# Patient Record
Sex: Male | Born: 1970 | Race: White | Hispanic: No | Marital: Single | Smoking: Current every day smoker
Health system: Southern US, Community
[De-identification: ages and names within clinical notes are randomized; demographics above are authoritative.]

## PROBLEM LIST (undated history)

## (undated) DIAGNOSIS — H5440 Blindness, one eye, unspecified eye: Secondary | ICD-10-CM

## (undated) DIAGNOSIS — M419 Scoliosis, unspecified: Secondary | ICD-10-CM

## (undated) DIAGNOSIS — F419 Anxiety disorder, unspecified: Secondary | ICD-10-CM

## (undated) HISTORY — DX: Blindness, one eye, unspecified eye: H54.40

## (undated) HISTORY — PX: TYMPANOSTOMY TUBE PLACEMENT: SHX32

## (undated) HISTORY — DX: Scoliosis, unspecified: M41.9

---

## 2008-06-09 ENCOUNTER — Emergency Department (HOSPITAL_COMMUNITY): Admission: EM | Admit: 2008-06-09 | Discharge: 2008-06-09 | Payer: Self-pay | Admitting: Emergency Medicine

## 2008-09-16 ENCOUNTER — Emergency Department (HOSPITAL_COMMUNITY): Admission: EM | Admit: 2008-09-16 | Discharge: 2008-09-16 | Payer: Self-pay | Admitting: Emergency Medicine

## 2012-09-01 ENCOUNTER — Encounter (HOSPITAL_COMMUNITY): Payer: Self-pay | Admitting: *Deleted

## 2012-09-01 ENCOUNTER — Emergency Department (HOSPITAL_COMMUNITY)
Admission: EM | Admit: 2012-09-01 | Discharge: 2012-09-01 | Disposition: A | Discharge status: Home / Self Care | Payer: Self-pay | Attending: Emergency Medicine | Admitting: Emergency Medicine

## 2012-09-01 DIAGNOSIS — R059 Cough, unspecified: Secondary | ICD-10-CM | POA: Insufficient documentation

## 2012-09-01 DIAGNOSIS — F17200 Nicotine dependence, unspecified, uncomplicated: Secondary | ICD-10-CM | POA: Insufficient documentation

## 2012-09-01 DIAGNOSIS — J3489 Other specified disorders of nose and nasal sinuses: Secondary | ICD-10-CM | POA: Insufficient documentation

## 2012-09-01 DIAGNOSIS — J209 Acute bronchitis, unspecified: Secondary | ICD-10-CM | POA: Insufficient documentation

## 2012-09-01 DIAGNOSIS — J069 Acute upper respiratory infection, unspecified: Secondary | ICD-10-CM | POA: Insufficient documentation

## 2012-09-01 DIAGNOSIS — R05 Cough: Secondary | ICD-10-CM | POA: Insufficient documentation

## 2012-09-01 DIAGNOSIS — J40 Bronchitis, not specified as acute or chronic: Secondary | ICD-10-CM

## 2012-09-01 DIAGNOSIS — IMO0001 Reserved for inherently not codable concepts without codable children: Secondary | ICD-10-CM | POA: Insufficient documentation

## 2012-09-01 MED ORDER — PREDNISONE 10 MG PO TABS: ORAL_TABLET | ORAL | Status: DC

## 2012-09-01 MED ORDER — AMOXICILLIN 500 MG PO CAPS: 500.0000 mg | ORAL_CAPSULE | Freq: Three times a day (TID) | ORAL | Status: DC

## 2012-09-01 MED ORDER — PROMETHAZINE-DM 6.25-15 MG/5ML PO SYRP: 5.0000 mL | ORAL_SOLUTION | Freq: Four times a day (QID) | ORAL | Status: DC | PRN

## 2012-09-01 NOTE — ED Provider Notes (Signed)
Medical screening examination/treatment/procedure(s) were performed by non-physician practitioner and as supervising physician I was immediately available for consultation/collaboration.  Darris Staiger, MD 09/01/12 1729 

## 2012-09-01 NOTE — ED Notes (Signed)
Productive cough, yellow in color, began Tuesday. Fever, congestion, per pt.

## 2012-09-01 NOTE — ED Provider Notes (Signed)
History     CSN: [id number]  Arrival date & time 09/01/12  1559   First MD Initiated Contact with Patient 09/01/12 1630      Chief Complaint  Patient presents with  . Cough  . Fever  . Nasal Congestion    (Consider location/radiation/quality/duration/timing/severity/associated sxs/prior treatment) Patient is a 42 y.o. male presenting with cough and fever. The history is provided by the patient.  Cough Cough characteristics:  Productive Sputum characteristics:  Yellow Severity:  Moderate Onset quality:  Gradual Timing:  Intermittent Progression:  Worsening Chronicity:  New Smoker: yes   Context: sick contacts and upper respiratory infection   Relieved by:  Nothing Worsened by:  Nothing tried Associated symptoms: fever, myalgias, rhinorrhea and sinus congestion   Associated symptoms: no chest pain, no eye discharge, no shortness of breath and no wheezing   Fever Associated symptoms: congestion, cough, myalgias and rhinorrhea   Associated symptoms: no chest pain, no confusion and no dysuria     History reviewed. No pertinent past medical history.  History reviewed. No pertinent past surgical history.  No family history on file.  History  Substance Use Topics  . Smoking status: Current Every Day Smoker  . Smokeless tobacco: Not on file  . Alcohol Use: Yes     Comment: Occ.      Review of Systems  Constitutional: Positive for fever. Negative for activity change.       All ROS Neg except as noted in HPI  HENT: Positive for congestion and rhinorrhea. Negative for nosebleeds and neck pain.   Eyes: Negative for photophobia and discharge.  Respiratory: Positive for cough. Negative for shortness of breath and wheezing.   Cardiovascular: Negative for chest pain and palpitations.  Gastrointestinal: Negative for abdominal pain and blood in stool.  Genitourinary: Negative for dysuria, frequency and hematuria.  Musculoskeletal: Positive for myalgias. Negative for back  pain and arthralgias.  Skin: Negative.   Neurological: Negative for dizziness, seizures and speech difficulty.  Psychiatric/Behavioral: Negative for hallucinations and confusion.    Allergies  Ceclor  Home Medications  No current outpatient prescriptions on file.  BP 102/50  Pulse 97  Temp(Src) 99.6 F (37.6 C) (Oral)  Resp 20  Ht 6\' 2"  (1.88 m)  Wt 124 lb 9.6 oz (56.518 kg)  BMI 15.99 kg/m2  SpO2 100%  Physical Exam  Nursing note and vitals reviewed. Constitutional: He is oriented to person, place, and time. He appears well-developed and well-nourished.  Non-toxic appearance.  HENT:  Head: Normocephalic.  Right Ear: Tympanic membrane and external ear normal.  Left Ear: Tympanic membrane and external ear normal.  Nasal congestion  Eyes: EOM and lids are normal. Pupils are equal, round, and reactive to light.  Neck: Normal range of motion. Neck supple. Carotid bruit is not present.  Cardiovascular: Normal rate, regular rhythm, normal heart sounds, intact distal pulses and normal pulses.   Pulmonary/Chest: No respiratory distress. He has no decreased breath sounds. He has no wheezes. He has rhonchi. He has no rales.  Abdominal: Soft. Bowel sounds are normal. There is no tenderness. There is no guarding.  Musculoskeletal: Normal range of motion.  Lymphadenopathy:       Head (right side): No submandibular adenopathy present.       Head (left side): No submandibular adenopathy present.    He has no cervical adenopathy.  Neurological: He is alert and oriented to person, place, and time. He has normal strength. No cranial nerve deficit or sensory deficit.  Skin: Skin  is warm and dry.  Psychiatric: He has a normal mood and affect. His speech is normal.    ED Course  Procedures (including critical care time)  Labs Reviewed - No data to display No results found.  Pulse Ox 100% on room air. WNL by my interpretation. No diagnosis found.    MDM  I have reviewed nursing  notes, vital signs, and all appropriate lab and imaging results for this patient. Exam is consistent with bronchitis and uri. Vital signs reviewed. Plan - Rx for amoxil deltasone and promethazine DM given. Pt to increase fluids and wash hands frequently.       Kathie Dike, [place] 09/01/12 1659

## 2016-04-29 ENCOUNTER — Emergency Department (HOSPITAL_COMMUNITY)
Admission: EM | Admit: 2016-04-29 | Discharge: 2016-05-01 | Payer: Medicaid Other | Attending: Emergency Medicine | Admitting: Emergency Medicine

## 2016-04-29 ENCOUNTER — Encounter (HOSPITAL_COMMUNITY): Payer: Self-pay | Admitting: *Deleted

## 2016-04-29 DIAGNOSIS — F17200 Nicotine dependence, unspecified, uncomplicated: Secondary | ICD-10-CM | POA: Diagnosis not present

## 2016-04-29 DIAGNOSIS — F259 Schizoaffective disorder, unspecified: Secondary | ICD-10-CM | POA: Diagnosis not present

## 2016-04-29 DIAGNOSIS — Z79899 Other long term (current) drug therapy: Secondary | ICD-10-CM | POA: Diagnosis not present

## 2016-04-29 DIAGNOSIS — R45851 Suicidal ideations: Secondary | ICD-10-CM

## 2016-04-29 DIAGNOSIS — Y999 Unspecified external cause status: Secondary | ICD-10-CM | POA: Insufficient documentation

## 2016-04-29 DIAGNOSIS — Y939 Activity, unspecified: Secondary | ICD-10-CM | POA: Diagnosis not present

## 2016-04-29 DIAGNOSIS — T1491XA Suicide attempt, initial encounter: Secondary | ICD-10-CM | POA: Insufficient documentation

## 2016-04-29 DIAGNOSIS — Y929 Unspecified place or not applicable: Secondary | ICD-10-CM | POA: Diagnosis not present

## 2016-04-29 DIAGNOSIS — T50902A Poisoning by unspecified drugs, medicaments and biological substances, intentional self-harm, initial encounter: Secondary | ICD-10-CM

## 2016-04-29 DIAGNOSIS — X58XXXA Exposure to other specified factors, initial encounter: Secondary | ICD-10-CM | POA: Diagnosis not present

## 2016-04-29 LAB — CBC WITH DIFFERENTIAL/PLATELET
BASOS ABS: 0.1 10*3/uL (ref 0.0–0.1)
BASOS PCT: 1 %
EOS PCT: 4 %
Eosinophils Absolute: 0.3 10*3/uL (ref 0.0–0.7)
HCT: 41.9 % (ref 39.0–52.0)
Hemoglobin: 14.1 g/dL (ref 13.0–17.0)
LYMPHS PCT: 29 %
Lymphs Abs: 2.3 10*3/uL (ref 0.7–4.0)
MCH: 29.7 pg (ref 26.0–34.0)
MCHC: 33.7 g/dL (ref 30.0–36.0)
MCV: 88.4 fL (ref 78.0–100.0)
MONO ABS: 0.9 10*3/uL (ref 0.1–1.0)
Monocytes Relative: 11 %
NEUTROS ABS: 4.5 10*3/uL (ref 1.7–7.7)
Neutrophils Relative %: 55 %
PLATELETS: 248 10*3/uL (ref 150–400)
RBC: 4.74 MIL/uL (ref 4.22–5.81)
RDW: 13 % (ref 11.5–15.5)
WBC: 8.1 10*3/uL (ref 4.0–10.5)

## 2016-04-29 LAB — URINALYSIS, ROUTINE W REFLEX MICROSCOPIC
BILIRUBIN URINE: NEGATIVE
Glucose, UA: NEGATIVE mg/dL
LEUKOCYTES UA: NEGATIVE
NITRITE: NEGATIVE
Protein, ur: NEGATIVE mg/dL
Specific Gravity, Urine: 1.02 (ref 1.005–1.030)
pH: 6 (ref 5.0–8.0)

## 2016-04-29 LAB — COMPREHENSIVE METABOLIC PANEL
ALK PHOS: 68 U/L (ref 38–126)
ALT: 15 U/L — AB (ref 17–63)
ANION GAP: 7 (ref 5–15)
AST: 20 U/L (ref 15–41)
Albumin: 4.9 g/dL (ref 3.5–5.0)
BILIRUBIN TOTAL: 0.6 mg/dL (ref 0.3–1.2)
BUN: 13 mg/dL (ref 6–20)
CALCIUM: 9.6 mg/dL (ref 8.9–10.3)
CO2: 30 mmol/L (ref 22–32)
Chloride: 99 mmol/L — ABNORMAL LOW (ref 101–111)
Creatinine, Ser: 0.87 mg/dL (ref 0.61–1.24)
GFR calc Af Amer: >60 mL/min (ref >60)
Glucose, Bld: 113 mg/dL — ABNORMAL HIGH (ref 65–99)
POTASSIUM: 3.9 mmol/L (ref 3.5–5.1)
Sodium: 136 mmol/L (ref 135–145)
TOTAL PROTEIN: 7.7 g/dL (ref 6.5–8.1)

## 2016-04-29 LAB — RAPID URINE DRUG SCREEN, HOSP PERFORMED
Amphetamines: NOT DETECTED
BENZODIAZEPINES: NOT DETECTED
Barbiturates: NOT DETECTED
COCAINE: NOT DETECTED
OPIATES: NOT DETECTED
Tetrahydrocannabinol: POSITIVE — AB

## 2016-04-29 LAB — URINE MICROSCOPIC-ADD ON

## 2016-04-29 LAB — SALICYLATE LEVEL

## 2016-04-29 LAB — ACETAMINOPHEN LEVEL: Acetaminophen (Tylenol), Serum: <10 ug/mL — ABNORMAL LOW (ref 10–30)

## 2016-04-29 NOTE — BH Assessment (Signed)
Assessor spoke with Jared Adams in the ED and she reports the pt is in the hallway and will need 15 minutes to set up TTS .  Princess BruinsAquicha Duff, MSW, Theresia MajorsLCSWA

## 2016-04-29 NOTE — ED Notes (Signed)
MD at bedside. 

## 2016-04-29 NOTE — ED Notes (Signed)
Aunt: [phone]

## 2016-04-29 NOTE — BH Assessment (Signed)
Assessor is unable to connect to the cart to complete the assessment. Valinda in the ED has been notified and stated she would contact the RN to inform them of the issue with connecting to the cart. Assessor will attempt to complete the assessment at a later time.  Princess BruinsAquicha Duff, MSW, Theresia MajorsLCSWA

## 2016-04-29 NOTE — BH Assessment (Signed)
Unable to connect to cart 1. Cart 2 is currently located on the 3rd floor. Assessor called the ED back to set up cart 1 in the pt's room but the line continued to ring with no answer.  Jared Adams, MSW, Theresia MajorsLCSWA

## 2016-04-29 NOTE — ED Triage Notes (Signed)
Pt states he has "had enough." Pt states that on Monday he took 10 haldol and 6 trazodone in order to "just not wake up." Pt's aunt states that the pt has not been sleeping, has been pacing the floors. Aunt states that he is like a zombie and she has never seen him like this before. Pt reports SI by overdosing on pills.

## 2016-04-29 NOTE — ED Provider Notes (Signed)
[facility] Provider Note   CSN: [id number] Arrival date & time: 04/29/16  2024     History   Chief Complaint Chief Complaint  Patient presents with  . V70.1    HPI Jared Adams is a 45 y.o. male.  HPI  Pt was seen at 2130. Per pt, c/o gradual onset and worsening of persistent depression and SI for the past several weeks. Pt states he "had enough" and OD on 10 haldol and 6 trazodone "in order not to wake up" 5 days ago. Pt states he does not take his psych meds "because they make me feel like a zombie." Denies HI, no hallucinations.     History reviewed. No pertinent past medical history.  There are no active problems to display for this patient.   Past Surgical History:  Procedure Laterality Date  . TYMPANOSTOMY TUBE PLACEMENT         Home Medications    Prior to Admission medications   Medication Sig Start Date End Date Taking? Authorizing Provider  haloperidol (HALDOL) 5 MG tablet Take 5 mg by mouth 2 (two) times daily.   Yes Historical Provider, MD  Melatonin 5 MG TABS Take 5 mg by mouth daily.    Yes Historical Provider, MD  traZODone (DESYREL) 100 MG tablet Take 100 mg by mouth at bedtime.   Yes Historical Provider, MD    Family History History reviewed. No pertinent family history.  Social History Social History  Substance Use Topics  . Smoking status: Current Some Day Smoker  . Smokeless tobacco: Never Used  . Alcohol use Yes     Comment: Occ.     Allergies   Ceclor [cefaclor]   Review of Systems Review of Systems ROS: Statement: All systems negative except as marked or noted in the HPI; Constitutional: Negative for fever and chills. ; ; Eyes: Negative for eye pain, redness and discharge. ; ; ENMT: Negative for ear pain, hoarseness, nasal congestion, sinus pressure and sore throat. ; ; Cardiovascular: Negative for chest pain, palpitations, diaphoresis, dyspnea and peripheral edema. ; ; Respiratory: Negative for cough, wheezing  and stridor. ; ; Gastrointestinal: Negative for nausea, vomiting, diarrhea, abdominal pain, blood in stool, hematemesis, jaundice and rectal bleeding. . ; ; Genitourinary: Negative for dysuria, flank pain and hematuria. ; ; Musculoskeletal: Negative for back pain and neck pain. Negative for swelling and trauma.; ; Skin: Negative for pruritus, rash, abrasions, blisters, bruising and skin lesion.; ; Neuro: Negative for headache, lightheadedness and neck stiffness. Negative for weakness, altered level of consciousness, altered mental status, extremity weakness, paresthesias, involuntary movement, seizure and syncope.; Psych:  +SI, +SA, no HI, no hallucinations.      Physical Exam Updated Vital Signs BP 105/74 (BP Location: Left Arm)   Pulse 72   Temp 98.5 F (36.9 C) (Oral)   Resp 16   Ht 6\' 2"  (1.88 m)   Wt 125 lb (56.7 kg)   SpO2 99%   BMI 16.05 kg/m   Physical Exam 2135: Physical examination:  Nursing notes reviewed; Vital signs and O2 SAT reviewed;  Constitutional: Thin, Well hydrated, In no acute distress; Head:  Normocephalic, atraumatic; Eyes: EOMI, PERRL, No scleral icterus; ENMT: Mouth and pharynx normal, Mucous membranes moist; Neck: Supple, Full range of motion; Cardiovascular: Regular rate and rhythm; Respiratory: Breath sounds clear, No wheezes.  Speaking full sentences with ease, Normal respiratory effort/excursion; Chest: No deformity, Movement normal; Abdomen: Nondistended; Extremities: No deformity.; Neuro: AA&Ox3, Major CN grossly intact.  Speech clear. No  gross focal motor deficits in extremities. Climbs on and off stretcher easily by himself. Gait steady.; Skin: Color normal, Warm, Dry.; Psych:  Affect flat. Endorses SI and SA.    ED Treatments / Results  Labs (all labs ordered are listed, but only abnormal results are displayed)   EKG  EKG Interpretation None       Radiology   Procedures Procedures (including critical care time)  Medications Ordered in  ED Medications - No data to display   Initial Impression / Assessment and Plan / ED Course  I have reviewed the triage vital signs and the nursing notes.  Pertinent labs & imaging results that were available during my care of the patient were reviewed by me and considered in my medical decision making (see chart for details).  MDM Reviewed: previous chart, nursing note and vitals Reviewed previous: labs Interpretation: labs   Results for orders placed or performed during the hospital encounter of 04/29/16  Urinalysis, Routine w reflex microscopic (not at [facility])  Result Value Ref Range   Color, Urine YELLOW YELLOW   APPearance CLEAR CLEAR   Specific Gravity, Urine 1.020 1.005 - 1.030   pH 6.0 5.0 - 8.0   Glucose, UA NEGATIVE NEGATIVE mg/dL   Hgb urine dipstick MODERATE (A) NEGATIVE   Bilirubin Urine NEGATIVE NEGATIVE   Ketones, ur TRACE (A) NEGATIVE mg/dL   Protein, ur NEGATIVE NEGATIVE mg/dL   Nitrite NEGATIVE NEGATIVE   Leukocytes, UA NEGATIVE NEGATIVE  Rapid urine drug screen (hospital performed)  Result Value Ref Range   Opiates NONE DETECTED NONE DETECTED   Cocaine NONE DETECTED NONE DETECTED   Benzodiazepines NONE DETECTED NONE DETECTED   Amphetamines NONE DETECTED NONE DETECTED   Tetrahydrocannabinol POSITIVE (A) NONE DETECTED   Barbiturates NONE DETECTED NONE DETECTED  Comprehensive metabolic panel  Result Value Ref Range   Sodium 136 135 - 145 mmol/L   Potassium 3.9 3.5 - 5.1 mmol/L   Chloride 99 (L) 101 - 111 mmol/L   CO2 30 22 - 32 mmol/L   Glucose, Bld 113 (H) 65 - 99 mg/dL   BUN 13 6 - 20 mg/dL   Creatinine, Ser 1.61 0.61 - 1.24 mg/dL   Calcium 9.6 8.9 - 09.6 mg/dL   Total Protein 7.7 6.5 - 8.1 g/dL   Albumin 4.9 3.5 - 5.0 g/dL   AST 20 15 - 41 U/L   ALT 15 (L) 17 - 63 U/L   Alkaline Phosphatase 68 38 - 126 U/L   Total Bilirubin 0.6 0.3 - 1.2 mg/dL   GFR calc non Af Amer >60 >60 mL/min   GFR calc Af Amer >60 >60 mL/min   Anion gap 7 5 - 15  CBC with  Differential  Result Value Ref Range   WBC 8.1 4.0 - 10.5 K/uL   RBC 4.74 4.22 - 5.81 MIL/uL   Hemoglobin 14.1 13.0 - 17.0 g/dL   HCT 04.5 40.9 - 81.1 %   MCV 88.4 78.0 - 100.0 fL   MCH 29.7 26.0 - 34.0 pg   MCHC 33.7 30.0 - 36.0 g/dL   RDW 91.4 78.2 - 95.6 %   Platelets 248 150 - 400 K/uL   Neutrophils Relative % 55 %   Neutro Abs 4.5 1.7 - 7.7 K/uL   Lymphocytes Relative 29 %   Lymphs Abs 2.3 0.7 - 4.0 K/uL   Monocytes Relative 11 %   Monocytes Absolute 0.9 0.1 - 1.0 K/uL   Eosinophils Relative 4 %   Eosinophils Absolute  0.3 0.0 - 0.7 K/uL   Basophils Relative 1 %   Basophils Absolute 0.1 0.0 - 0.1 K/uL  Acetaminophen level  Result Value Ref Range   Acetaminophen (Tylenol), Serum <10 (L) 10 - 30 ug/mL  Salicylate level  Result Value Ref Range   Salicylate Lvl <7.0 2.8 - 30.0 mg/dL  Urine microscopic-add on  Result Value Ref Range   Squamous Epithelial / LPF 0-5 (A) NONE SEEN   WBC, UA 0-5 0 - 5 WBC/hpf   RBC / HPF 6-30 0 - 5 RBC/hpf   Bacteria, UA RARE (A) NONE SEEN   Urine-Other MUCOUS PRESENT     2300:  TTS to evaluate. Holding orders written.    Final Clinical Impressions(s) / ED Diagnoses   Final diagnoses:  None    New Prescriptions New Prescriptions   No medications on file     Samuel JesterKathleen Hutton Pellicane, DO 04/30/16 0009

## 2016-04-29 NOTE — ED Notes (Signed)
Pt reports that he lives with a friend and that it is not a good situation. Also that his family helps him when they can. He states he does not follow with his mental health provider because of different reasons, that he has been depressed and that he took an overdose of trazodone last Monday in an attempt to "end it" as he states.  He is very thin, odoriferous, and is soft spoken. His aunt is at the bedside

## 2016-04-30 MED ORDER — HALOPERIDOL 5 MG PO TABS
5.0000 mg | ORAL_TABLET | Freq: Two times a day (BID) | ORAL | Status: DC
  Administered 2016-04-30 – 2016-05-01 (×3): 5 mg via ORAL
  Filled 2016-04-30 (×3): qty 1

## 2016-04-30 MED ORDER — NICOTINE 21 MG/24HR TD PT24
21.0000 mg | MEDICATED_PATCH | Freq: Every day | TRANSDERMAL | Status: DC
  Administered 2016-04-30 – 2016-05-01 (×2): 21 mg via TRANSDERMAL
  Filled 2016-04-30 (×2): qty 1

## 2016-04-30 MED ORDER — TRAZODONE HCL 50 MG PO TABS
100.0000 mg | ORAL_TABLET | Freq: Every day | ORAL | Status: DC
  Administered 2016-04-30 (×2): 100 mg via ORAL
  Filled 2016-04-30 (×2): qty 2

## 2016-04-30 MED ORDER — MELATONIN 5 MG PO TABS
5.0000 mg | ORAL_TABLET | Freq: Every day | ORAL | Status: DC
  Filled 2016-04-30 (×4): qty 1

## 2016-04-30 NOTE — ED Notes (Signed)
Pharmacy called and said that Melatonin is not provided from our pharmacy and pt would have to supply his own if wanted. PT made aware.

## 2016-04-30 NOTE — ED Notes (Signed)
Call from aunt Ms Larina BrasStone who is updated with pt's permission
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
## 2016-04-30 NOTE — Progress Notes (Signed)
Disposition CSW completed patient referrals to the following inpatient psych facilities:  [facility]    CSW will continue to follow patient for placement needs.  Seward SpeckLeo Nanetta Wiegman [facility]W,[facility]AS Behavioral Health Disposition CSW [phone]

## 2016-04-30 NOTE — ED Notes (Signed)
Pt refuses Haldol because he reports it makes his head fuzzy- He is encouraged to reconsider. He is quiet, calm and cooperative.

## 2016-05-01 ENCOUNTER — Encounter (HOSPITAL_COMMUNITY): Payer: Self-pay

## 2016-05-01 ENCOUNTER — Inpatient Hospital Stay (HOSPITAL_COMMUNITY)
Admission: AD | Admit: 2016-05-01 | Discharge: 2016-05-09 | DRG: 885 | Disposition: A | Discharge status: Home / Self Care | Payer: No Typology Code available for payment source | Source: Intra-hospital | Attending: Psychiatry | Admitting: Psychiatry

## 2016-05-01 DIAGNOSIS — E559 Vitamin D deficiency, unspecified: Secondary | ICD-10-CM | POA: Diagnosis present

## 2016-05-01 DIAGNOSIS — Z681 Body mass index (BMI) 19 or less, adult: Secondary | ICD-10-CM | POA: Diagnosis not present

## 2016-05-01 DIAGNOSIS — F259 Schizoaffective disorder, unspecified: Secondary | ICD-10-CM | POA: Diagnosis present

## 2016-05-01 DIAGNOSIS — G4700 Insomnia, unspecified: Secondary | ICD-10-CM | POA: Diagnosis present

## 2016-05-01 DIAGNOSIS — Z818 Family history of other mental and behavioral disorders: Secondary | ICD-10-CM

## 2016-05-01 DIAGNOSIS — E43 Unspecified severe protein-calorie malnutrition: Secondary | ICD-10-CM | POA: Diagnosis present

## 2016-05-01 DIAGNOSIS — F411 Generalized anxiety disorder: Secondary | ICD-10-CM | POA: Diagnosis present

## 2016-05-01 DIAGNOSIS — R64 Cachexia: Secondary | ICD-10-CM | POA: Diagnosis present

## 2016-05-01 DIAGNOSIS — Z79899 Other long term (current) drug therapy: Secondary | ICD-10-CM | POA: Diagnosis not present

## 2016-05-01 DIAGNOSIS — F251 Schizoaffective disorder, depressive type: Secondary | ICD-10-CM | POA: Diagnosis present

## 2016-05-01 DIAGNOSIS — F17210 Nicotine dependence, cigarettes, uncomplicated: Secondary | ICD-10-CM | POA: Diagnosis present

## 2016-05-01 DIAGNOSIS — E538 Deficiency of other specified B group vitamins: Secondary | ICD-10-CM | POA: Diagnosis present

## 2016-05-01 DIAGNOSIS — Z23 Encounter for immunization: Secondary | ICD-10-CM | POA: Diagnosis not present

## 2016-05-01 DIAGNOSIS — R45851 Suicidal ideations: Secondary | ICD-10-CM | POA: Diagnosis present

## 2016-05-01 DIAGNOSIS — F333 Major depressive disorder, recurrent, severe with psychotic symptoms: Secondary | ICD-10-CM | POA: Diagnosis not present

## 2016-05-01 MED ORDER — TRAZODONE HCL 100 MG PO TABS
100.0000 mg | ORAL_TABLET | Freq: Every day | ORAL | Status: DC
  Administered 2016-05-01: 100 mg via ORAL
  Filled 2016-05-01 (×3): qty 1

## 2016-05-01 MED ORDER — MAGNESIUM HYDROXIDE 400 MG/5ML PO SUSP: 30.0000 mL | Freq: Every day | ORAL | Status: DC | PRN

## 2016-05-01 MED ORDER — NICOTINE 21 MG/24HR TD PT24
21.0000 mg | MEDICATED_PATCH | Freq: Every day | TRANSDERMAL | Status: AC
  Administered 2016-05-02 – 2016-05-03 (×2): 21 mg via TRANSDERMAL
  Filled 2016-05-01 (×2): qty 1

## 2016-05-01 MED ORDER — PNEUMOCOCCAL VAC POLYVALENT 25 MCG/0.5ML IJ INJ
0.5000 mL | INJECTION | INTRAMUSCULAR | Status: AC
  Administered 2016-05-02: 0.5 mL via INTRAMUSCULAR

## 2016-05-01 MED ORDER — INFLUENZA VAC SPLIT QUAD 0.5 ML IM SUSY
0.5000 mL | PREFILLED_SYRINGE | INTRAMUSCULAR | Status: AC
  Administered 2016-05-02: 0.5 mL via INTRAMUSCULAR
  Filled 2016-05-01: qty 0.5

## 2016-05-01 MED ORDER — ENSURE ENLIVE PO LIQD: 237.0000 mL | Freq: Two times a day (BID) | ORAL | Status: DC

## 2016-05-01 MED ORDER — HALOPERIDOL 5 MG PO TABS
5.0000 mg | ORAL_TABLET | Freq: Two times a day (BID) | ORAL | Status: DC
  Administered 2016-05-01 – 2016-05-02 (×2): 5 mg via ORAL
  Filled 2016-05-01 (×5): qty 1

## 2016-05-01 MED ORDER — BENZTROPINE MESYLATE 0.5 MG PO TABS
0.5000 mg | ORAL_TABLET | Freq: Two times a day (BID) | ORAL | Status: DC
  Administered 2016-05-01 – 2016-05-02 (×2): 0.5 mg via ORAL
  Filled 2016-05-01 (×5): qty 1

## 2016-05-01 MED ORDER — ALUM & MAG HYDROXIDE-SIMETH 200-200-20 MG/5ML PO SUSP
30.0000 mL | ORAL | Status: DC | PRN
Start: 2016-05-01 — End: 2016-05-09

## 2016-05-01 MED ORDER — ACETAMINOPHEN 325 MG PO TABS: 650.0000 mg | ORAL_TABLET | Freq: Four times a day (QID) | ORAL | Status: DC | PRN

## 2016-05-01 NOTE — Progress Notes (Signed)
[facility] Group Notes:  (Nursing/MHT/Case Management/Adjunct)  Date:  05/01/2016  Time:  9:49 PM  Type of Therapy:  Psychoeducational Skills  Participation Level:  Minimal  Participation Quality:  Attentive  Affect:  Flat  Cognitive:  Lacking  Insight:  Limited  Engagement in Group:  Limited  Modes of Intervention:  Education  Summary of Progress/Problems: The patient expressed in group that he was pleased with the fact that he is no longer across the street in the hospital.   Hazle CocaGOODMAN, [name] 05/01/2016, 9:49 PM

## 2016-05-01 NOTE — Tx Team (Signed)
Initial Treatment Plan 05/01/2016 6:32 PM Jared Adams ZOX:[id number]    PATIENT STRESSORS: Financial difficulties Medication change or noncompliance   PATIENT STRENGTHS: Wellsite geologistCommunication skills General fund of knowledge Supportive family/friends   PATIENT IDENTIFIED PROBLEMS: Depression  Suicidal ideation  Psychosis  "Feel better about myself"  "Be able to take care of myself"             DISCHARGE CRITERIA:  Improved stabilization in mood, thinking, and/or behavior Verbal commitment to aftercare and medication compliance  PRELIMINARY DISCHARGE PLAN: Outpatient therapy Return to previous living arrangement Medication management  PATIENT/FAMILY INVOLVEMENT: This treatment plan has been presented to and reviewed with the patient, Jared Adams.  The patient and family have been given the opportunity to ask questions and make suggestions.  Levin BaconHeather V Jonathon Castelo, RN 05/01/2016, 6:32 PM

## 2016-05-01 NOTE — Progress Notes (Signed)
[facility].  Patient ate good dinner, stated he was not hungry after dinner.  Patient took his medications without any problems.  Patient denied SI and HI, contracts for safety while talking to nurse.  Denied A/V hallucinations.  Safety maintained with 15 minute checks.

## 2016-05-01 NOTE — ED Notes (Signed)
Out of bed to toilet

## 2016-05-01 NOTE — Progress Notes (Signed)
Jared Adams is a 45 year old male being admitted voluntarily to 506-1 from [facility].  He came to the ED with suicidal thoughts and attempted to OD.  He reported that his currently living situation is stressful.  He reported that his roommate told him that he has been pacing the floors but he doesn't remember doing this.  He stopped taking his medication 2-3 weeks ago because of side effects.  HE is reporting problems sleeping and hasn't slept in 2 days.  [facility] he became unresponsive but then reported that he has been hearing things and that he has "so many bad things in my head."  He reported feeling irritable, sad, worthless, isolation and thoughts of harming others at times ("when I am mad").  He is diagnosed with Schizoaffective disorder and Cannabis disorder. He currently denies SI/HI or A/V hallucinations.  He reports that his voices come and go.   He denies any medical issues and appears to be in no physical distress.  He is severely underweight and he stated that he hasn't been eating much because he doesn't have the money to get food.  Oriented him to the unit.  Admission paperwork completed and signed.  Belongings searched and secured in locker #  26.  Skin assessment completed and no skin issues noted.  Q 15 minute checks initiated for safety.  We will monitor the progress towards his goals.

## 2016-05-01 NOTE — Progress Notes (Signed)
[facility] attending Dr. Elna BreslowEappen, report [phone]. Requested voluntary consent be faxed to [phone].  Ilean SkillMeghan Chamari Cutbirth, MSW, LCSW Clinical Social Work, Disposition  05/01/2016 [phone]

## 2016-05-01 NOTE — Progress Notes (Signed)
D: Pt passive SI-contracts for safety denies HI/AVH. Pt is pleasant and cooperative. Pt getting accustomed to the unit, pt seen isolating to himself, but will talk when approached.   A: Pt was offered support and encouragement. Pt was given scheduled medications. Pt was encourage to attend groups. Q 15 minute checks were done for safety.   R:Pt attends groups and interacts well with peers and staff. Pt is taking medication. Pt has no complaints at this time .Pt receptive to treatment and safety maintained on unit.

## 2016-05-01 NOTE — ED Notes (Signed)
[facility].

## 2016-05-02 ENCOUNTER — Encounter (HOSPITAL_COMMUNITY): Payer: Self-pay | Admitting: Psychiatry

## 2016-05-02 DIAGNOSIS — F17210 Nicotine dependence, cigarettes, uncomplicated: Secondary | ICD-10-CM

## 2016-05-02 DIAGNOSIS — F333 Major depressive disorder, recurrent, severe with psychotic symptoms: Principal | ICD-10-CM

## 2016-05-02 DIAGNOSIS — Z79899 Other long term (current) drug therapy: Secondary | ICD-10-CM

## 2016-05-02 DIAGNOSIS — Z818 Family history of other mental and behavioral disorders: Secondary | ICD-10-CM

## 2016-05-02 MED ORDER — TRAZODONE HCL 100 MG PO TABS
100.0000 mg | ORAL_TABLET | Freq: Every evening | ORAL | Status: DC | PRN
  Administered 2016-05-02: 100 mg via ORAL
  Filled 2016-05-02 (×3): qty 1

## 2016-05-02 MED ORDER — OLANZAPINE 10 MG IM SOLR: 2.5000 mg | Freq: Three times a day (TID) | INTRAMUSCULAR | Status: DC | PRN

## 2016-05-02 MED ORDER — OLANZAPINE 5 MG PO TABS
5.0000 mg | ORAL_TABLET | Freq: Every day | ORAL | Status: DC
  Administered 2016-05-02 – 2016-05-08 (×7): 5 mg via ORAL
  Filled 2016-05-02 (×7): qty 1
  Filled 2016-05-02 (×2): qty 7
  Filled 2016-05-02: qty 1

## 2016-05-02 MED ORDER — TRAZODONE HCL 100 MG PO TABS
100.0000 mg | ORAL_TABLET | Freq: Every day | ORAL | Status: DC
  Administered 2016-05-02 – 2016-05-08 (×7): 100 mg via ORAL
  Filled 2016-05-02 (×5): qty 1
  Filled 2016-05-02: qty 7
  Filled 2016-05-02 (×2): qty 1
  Filled 2016-05-02: qty 7
  Filled 2016-05-02: qty 1

## 2016-05-02 MED ORDER — CITALOPRAM HYDROBROMIDE 20 MG PO TABS
20.0000 mg | ORAL_TABLET | Freq: Every day | ORAL | Status: DC
  Administered 2016-05-02 – 2016-05-08 (×7): 20 mg via ORAL
  Filled 2016-05-02 (×9): qty 1

## 2016-05-02 MED ORDER — ENSURE ENLIVE PO LIQD
237.0000 mL | Freq: Three times a day (TID) | ORAL | Status: DC
  Administered 2016-05-02 – 2016-05-09 (×23): 237 mL via ORAL

## 2016-05-02 MED ORDER — HYDROXYZINE HCL 25 MG PO TABS
25.0000 mg | ORAL_TABLET | Freq: Four times a day (QID) | ORAL | Status: DC | PRN
  Administered 2016-05-02 – 2016-05-07 (×5): 25 mg via ORAL
  Filled 2016-05-02 (×6): qty 1
  Filled 2016-05-02: qty 10

## 2016-05-02 MED ORDER — OLANZAPINE 2.5 MG PO TABS
2.5000 mg | ORAL_TABLET | Freq: Three times a day (TID) | ORAL | Status: DC | PRN
  Administered 2016-05-05 – 2016-05-07 (×3): 2.5 mg via ORAL
  Filled 2016-05-02 (×4): qty 1

## 2016-05-02 NOTE — Progress Notes (Signed)
Adult Psychoeducational Group Note  Date:  05/02/2016 Time:  8:56 PM  Group Topic/Focus:  Wrap-Up Group:   The focus of this group is to help patients review their daily goal of treatment and discuss progress on daily workbooks.   Participation Level:  Active  Participation Quality:  Appropriate  Affect:  Appropriate  Cognitive:  Appropriate  Insight: Appropriate  Engagement in Group:  Engaged  Modes of Intervention:  Discussion  Additional Comments:  The patient expressed that he attended groups.The patient also said that he rates today a 7. Octavio Mannshigpen, Deajah Erkkila Lee 05/02/2016, 8:56 PM

## 2016-05-02 NOTE — Plan of Care (Signed)
Problem: Coping: Goal: Ability to cope will improve Outcome: Progressing Pt stated he felt depressed this evening, but was feeling a little better than he felt yesterday

## 2016-05-02 NOTE — Progress Notes (Signed)
Patient ID: Gayla DossJoseph B Conde, male   DOB: 1970/09/28, 45 y.o.   MRN: [id number]   D  ---   Pt agrees to contract for safety and denies pain.  He maintains  disheveled appearence and is very under weight.  He has difficulty eating or drinking and tends to drool down his chin .  He is a not not admit because of his bizarre behaviors. Pt has straight forward gaze and slow gait.  He shows no negative behaviors , appears to be mildly confused.  Pt EKG is com[pleted and posted on front of his chart.  --- A ---  Support provided  ---  R  --  Pt remains safe on unit
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
## 2016-05-02 NOTE — Progress Notes (Signed)
Recreation Therapy Notes  Date: 05/02/16 Time: 1000 Location: 500 Hall Dayroom  Group Topic: Coping Skills  Goal Area(s) Addresses:  Pt will able to identify positive coping skills. Pt will be able to identify the importance of coping skills. Pt will be abel to identify how using coping skills will help them post d/c.  Intervention: Worksheet, scissors, glue sticks, magazines  Activity: My Engineer, siteCoping Skills Worksheet.  Patients were given worksheets divided into five areas where coping skills can be used.  The areas were diversions, social, cognitive, tension releasers and physical.  Patients were to use magazines to cut out pictures that depict coping skills that could be used in each area.  Patients were to come up with at least two coping skills per area.   Education: PharmacologistCoping Skills, Building control surveyorDischarge Planning.   Education Outcome: Acknowledges understanding/In group clarification offered/Needs additional education.   Clinical Observations/Feedback: Pt did not attend group.   Caroll RancherMarjette Dequane Strahan, LRT/CTRS     Caroll RancherLindsay, Altair Stanko A 05/02/2016 12:31 PM

## 2016-05-02 NOTE — B[facility] Suicide Risk Assessment (Signed)
[facility]   Nursing information obtained from:  Patient Demographic factors:  Male, Caucasian, Low socioeconomic status Current Mental Status:  NA Loss Factors:  Decline in physical health, Financial problems / change in socioeconomic status Historical Factors:  Family history of mental illness or substance abuse, Family history of suicide, Prior suicide attempts Risk Reduction Factors:  NA  Total Time spent with patient: 30 minutes Principal Problem: MDD (major depressive disorder), recurrent, severe, with psychosis (HCC) Diagnosis:   Patient Active Problem List   Diagnosis Date Noted  . MDD (major depressive disorder), recurrent, severe, with psychosis (HCC) [F33.3] 05/02/2016   Subjective Data: Please see H&P.   Continued Clinical Symptoms:  Alcohol Use Disorder Identification Test Final Score (AUDIT): 1 The "Alcohol Use Disorders Identification Test", Guidelines for Use in Primary Care, Second Edition.  World Science writerHealth Organization [facility](WHO). Score between 0-7:  no or low risk or alcohol related problems. Score between 8-15:  moderate risk of alcohol related problems. Score between 16-19:  high risk of alcohol related problems. Score 20 or above:  warrants further diagnostic evaluation for alcohol dependence and treatment.   CLINICAL FACTORS:   Depression:   Hopelessness Impulsivity Unstable or Poor Therapeutic Relationship Previous Psychiatric Diagnoses and Treatments Medical Diagnoses and Treatments/Surgeries   Musculoskeletal: Strength & Muscle Tone: within normal limits Gait & Station: normal Patient leans: N/A  Psychiatric Specialty Exam: Physical Exam  ROS  Blood pressure (!) 91/59, pulse 97, temperature 98 F (36.7 C), temperature source Oral, resp. rate 16, height 6' 0.5" (1.842 m), weight 51.3 kg (113 lb).Body mass index is 15.11 kg/m.                  Please see H&P.                                          COGNITIVE FEATURES THAT CONTRIBUTE TO RISK:  Closed-mindedness, Polarized thinking and Thought constriction (tunnel vision)    SUICIDE RISK:   Severe:  Frequent, intense, and enduring suicidal ideation, specific plan, no subjective intent, but some objective markers of intent (i.e., choice of lethal method), the method is accessible, some limited preparatory behavior, evidence of impaired self-control, severe dysphoria/symptomatology, multiple risk factors present, and few if any protective factors, particularly a lack of social support.   PLAN OF CARE: Please see H&P.  I certify that inpatient services furnished can reasonably be expected to improve the patient's condition.  Margarette Vannatter, MD 05/02/2016, 11:17 AM

## 2016-05-02 NOTE — Progress Notes (Signed)
D: Pt passive SI- contracts for safety. denies HI/AVH. Pt is pleasant and cooperative. Pt continues to isolate to self, but pt is visible on the unit at times. Pt stated he did not like the drama of living with his roommate, but pt plans to stay with his aunt on D/C. Pt said the Haldol made him feel like a Zombie, so he wanted to get a medication that helped him and did not make him feel drowsy all day long.   A: Pt was offered support and encouragement. Pt was given scheduled medications. Pt was encourage to attend groups. Q 15 minute checks were done for safety.   R:Pt attends groups  Pt is taking medication. Pt receptive to treatment and safety maintained on unit.

## 2016-05-02 NOTE — Progress Notes (Signed)
NUTRITION ASSESSMENT  Pt identified as at risk on the Malnutrition Screen Tool  INTERVENTION: 1. Supplements: Ensure Enlive po TID, each supplement provides 350 kcal and 20 grams of protein  NUTRITION DIAGNOSIS: Unintentional weight loss related to sub-optimal intake as evidenced by pt report.   Goal: Pt to meet >/= 90% of their estimated nutrition needs.  Monitor:  PO intake  Assessment:  Pt admitted with schizoaffective disorder. Pt reports not being able to afford food which is why he has not been eating well PTA. Pt has been ordered Ensure supplements BID, will increase to TID given poor PO intake and underweight status. Chart review shows 12 lb weight loss over 2 days, suspect this is an error. Would expect weight loss given poor PO but unable to quantify at this time.    Height: Ht Readings from Last 1 Encounters:  05/01/16 6' 0.5" (1.842 m)    Weight: Wt Readings from Last 1 Encounters:  05/01/16 113 lb (51.3 kg)    Weight Hx: Wt Readings from Last 10 Encounters:  05/01/16 113 lb (51.3 kg)  04/29/16 125 lb (56.7 kg)  09/01/12 124 lb 9.6 oz (56.5 kg)    BMI:  Body mass index is 15.11 kg/m. Pt meets criteria for underweight based on current BMI.  Estimated Nutritional Needs: Kcal: 25-30 kcal/kg Protein: > 1 gram protein/kg Fluid: 1 ml/kcal  Diet Order: Diet regular Room service appropriate? Yes; Fluid consistency: Thin Pt is also offered choice of unit snacks mid-morning and mid-afternoon.  Pt is eating as desired.   Lab results and medications reviewed.   Tilda FrancoLindsey Palyn Scrima, MS, RD, LDN Pager: [phone] After Hours Pager: [phone]

## 2016-05-02 NOTE — Progress Notes (Signed)
Recreation Therapy Notes  INPATIENT RECREATION THERAPY ASSESSMENT  Patient Details Name: Gayla DossJoseph B Dock MRN: [id number] DOB: May 12, 1971 Today's Date: 05/02/2016  Patient Stressors: Other (Comment) (Living situation)  Pt stated he was here because he needed some help.  Coping Skills:   Isolate, Arguments, Substance Abuse, Self-Injury, Exercise, Talking, Music  Personal Challenges: Anger, Communication, Concentration, Decision-Making, Expressing Yourself, Problem-Solving, Relationships, Self-Esteem/Confidence, Social Interaction, Stress Management, Substance Abuse, Time Management, Trusting Others  Leisure Interests (2+):  Individual - TV, Music - Listen  Awareness of Community Resources:  No  Patient Strengths:  Good personality, Kind hearted  Patient Identified Areas of Improvement:  Attitude, dealing with certain situations (not worring so much)  Current Recreation Participation:  Every other day  Patient Goal for Hospitalization:  "Better myself"  [place]   Current [place] (including self-harm):  Yes (Rated a 5, contracted for safety)  Current HI:  No  Consent to Intern Participation: N/A   Caroll RancherMarjette Ralph Brouwer, LRT/CTRS  Caroll RancherLindsay, Sarath Privott A 05/02/2016, 12:39 PM

## 2016-05-02 NOTE — BHH Group Notes (Signed)
BHH LCSW Group Therapy  05/02/2016 12:05 PM   Type of Therapy:  Group Therapy  Participation Level:  Active  Participation Quality:  Attentive  Affect:  Appropriate  Cognitive:  Appropriate  Insight:  Improving  Engagement in Therapy:  Engaged  Modes of Intervention:  Clarification, Education, Exploration and Socialization  Summary of Progress/Problems: Today's group focused on resilience.  Stayed the entire time, engaged throughout.  Talked about how being released from incarceration required the ability to bounce back "because I was coming out to nothing. I had no place to stay, no supports and I lost the right to see my son.  It's not been easy, but I have a stubborn streak that has kept me going." Colleenfortorth, Teofil Maniaci B 05/02/2016 , 12:05 PM

## 2016-05-02 NOTE — H&P (Signed)
Psychiatric Admission Assessment Adult  Patient Identification: Jared Adams MRN:  [id number] Date of Evaluation:  05/02/2016 Chief Complaint: Patient states " I was tried to kill myself, it was my living situation."   Principal Diagnosis: MDD (major depressive disorder), recurrent, severe, with psychosis (HCC) Diagnosis:   Patient Active Problem List   Diagnosis Date Noted  . MDD (major depressive disorder), recurrent, severe, with psychosis (HCC) [F33.3] 05/02/2016     History of Present Illness: Jared DossJoseph B Dobkins is a 45 y.o. [place] male who is single , unemployed ( has applied for disability) , lives [place] a friend in [place], [place], has a hx of ? Schizoaffective do versus MDD, who presented to [facility] , brought in by his aunt after a suicide attempt by OD on his medications.  Per initial notes in EHR : ' Pt presents to the ED voluntarily. Pt reports he attempted to OD on medication and is still feeling suicidal that he "just does not want to wake up." Pt reports he has been feeling suicidal lately and when asked for his recent stressors he stated "just my living situation is stressful." Pt reports he lives with a roommate and pt's roommate told him he was "pacing around the room back and forth" and the pt states he does not recall this. During the assessment, the pt was experiencing thought blocking as he suddenly stopped talking and became unresponsive for about 10 seconds. The pt reports he sometimes "hears things" and stated he has "so many bad things in my head." Pt reports he stopped taking his medication about 2 or 3 weeks ago because it was making him feel lethargic. Pt reports he has not slept in 2 days and pt reports he eats only once per day. Pt endorses depressive symptoms including isolating himself, irritable feelings, and feeling sad and worthless "everyday." Pt denies H/I however when he was asked he stated, "sometimes when people make me mad I think about hurting  them."   Patient seen and chart reviewed today .Discussed patient with treatment team. Patient today is seen as lethargic, emaciated , reports he is depressed and had attempted to OD on several 5 mg Haldol and 100 mg trazodone prior to admission. Pt reports he has a hx of depression and has [facility]. Pt reports he does not feel haldol is helping much , hence had stopped taking it. Pt also reports that Haldol gives his side effects and makes him "zombie like." Patient reports sadness daily since the past several days , sleep issues, hopelessness, difficulty concentrating , and SI , is S/P attempt. Pt keeps repeating that "it is my life situation." Patient reports he has been living with a friend since the past several years , he is unemployed , he eats whatever the friend gives him , he is unable to work due to his physical and emotional problems, he is currently too emaciated , has difficulty even getting out of bed often. Pt reports AH - telling him negative things. Pt with several psychosocial stressors - has no family support, has had legal issues in the past , is unemployed , financial issues.  Pt reports this is his first admission to a psychiatric hospital and reports this as his first suicide attempt.  Pt reports he does not know if he has any medical issues since he has not had a health check up with a PMD since he were a teenager.   Associated Signs/Symptoms: Depression Symptoms:  depressed mood,  anhedonia, insomnia, psychomotor retardation, fatigue, feelings of worthlessness/guilt, difficulty concentrating, hopelessness, recurrent thoughts of death, suicidal thoughts with specific plan, suicidal attempt, anxiety, weight loss, (Hypo) Manic Symptoms:  Hallucinations, Impulsivity, Anxiety Symptoms:  Excessive Worry, Psychotic Symptoms:  Hallucinations: Auditory PTSD Symptoms: Negative Total Time spent with patient: 45 minutes  Past Psychiatric  History: Patient with hx of depression , [facility], denies any hx of IP admission prior to this, denies suicide attempts prior to this one which led to his admission this time.  Is the patient at risk to self? Yes.    Has the patient been a risk to self in the past 6 months? No.  Has the patient been a risk to self within the distant past? No.  Is the patient a risk to others? No.  Has the patient been a risk to others in the past 6 months? No.  Has the patient been a risk to others within the distant past? No.   Prior Inpatient Therapy:   Prior Outpatient Therapy:    Alcohol Screening: 1. How often do you have a drink containing alcohol?: Monthly or less 2. How many drinks containing alcohol do you have on a typical day when you are drinking?: 1 or 2 3. How often do you have six or more drinks on one occasion?: Never Preliminary Score: 0 9. Have you or someone else been injured as a result of your drinking?: No 10. Has a relative or friend or a doctor or another health worker been concerned about your drinking or suggested you cut down?: No Alcohol Use Disorder Identification Test Final Score (AUDIT): 1 Brief Intervention: AUDIT score less than 7 or less-screening does not suggest unhealthy drinking-brief intervention not indicated Substance Abuse History in the last 12 months:  Yes.  cannabis occasional Consequences of Substance Abuse: Negative Previous Psychotropic Medications: Yes celexa,( good effect) , haldol( side effects) Psychological Evaluations: No  Past Medical History: Patient reports he has not been to a medical doctor for a check up in years. Past Surgical History:  Procedure Laterality Date  . TYMPANOSTOMY TUBE PLACEMENT     Family History:  Family History  Problem Relation Age of Onset  . Schizophrenia Maternal Uncle   . Suicidality Maternal Uncle    Family Psychiatric  History: Patient reports his uncle had schizophrenia and killed himself by  jumping off a cellphone tower. Tobacco Screening: Have you used any form of tobacco in the last 30 days? (Cigarettes, Smokeless Tobacco, Cigars, and/or Pipes): Yes Tobacco use, Select all that apply: 5 or more cigarettes per day Are you interested in Tobacco Cessation Medications?: Yes, will notify MD for an order Counseled patient on smoking cessation including recognizing danger situations, developing coping skills and basic information about quitting provided: Refused/Declined practical counseling Social History:  Patient is unemployed , lives with a friend who lets him stay , has no source of income , is unemployed and is unable to work due to being physically limited and also emotionally. History  Alcohol Use  . Yes    Comment: Occ.     History  Drug Use  . Types: Marijuana    Additional Social History:                           Allergies:   Allergies  Allergen Reactions  . Ceclor [Cefaclor] Hives and Rash   Lab Results: No results found for this or any previous visit (from  the past 48 hour(s)).  Blood Alcohol level:  No results found for: [facility]  Metabolic Disorder Labs:  No results found for: HGBA1C, MPG No results found for: PROLACTIN No results found for: CHOL, TRIG, HDL, CHOLHDL, VLDL, LDLCALC  Current Medications: Current Facility-Administered Medications  Medication Dose Route Frequency Provider Last Rate Last Dose  . acetaminophen (TYLENOL) tablet 650 mg  650 mg Oral Q6H PRN Thermon Leyland, NP      . alum & mag hydroxide-simeth (MAALOX/MYLANTA) 200-200-20 MG/5ML suspension 30 mL  30 mL Oral Q4H PRN Thermon Leyland, NP      . citalopram (CELEXA) tablet 20 mg  20 mg Oral Daily Wynne Jury, MD   20 mg at 05/02/16 1046  . feeding supplement (ENSURE ENLIVE) (ENSURE ENLIVE) liquid 237 mL  237 mL Oral TID BM Cuma Polyakov, MD   237 mL at 05/02/16 1031  . hydrOXYzine (ATARAX/VISTARIL) tablet 25 mg  25 mg Oral Q6H PRN Jomarie Longs, MD      . magnesium hydroxide  (MILK OF MAGNESIA) suspension 30 mL  30 mL Oral Daily PRN Thermon Leyland, NP      . nicotine (NICODERM CQ - dosed in mg/24 hours) patch 21 mg  21 mg Transdermal Daily Thermon Leyland, NP   21 mg at 05/02/16 0800  . OLANZapine (ZYPREXA) tablet 2.5 mg  2.5 mg Oral TID PRN Jomarie Longs, MD       Or  . OLANZapine (ZYPREXA) injection 2.5 mg  2.5 mg Intramuscular TID PRN Jomarie Longs, MD      . OLANZapine (ZYPREXA) tablet 5 mg  5 mg Oral QHS Jakarius Flamenco, MD      . traZODone (DESYREL) tablet 100 mg  100 mg Oral QHS Jomarie Longs, MD       PTA Medications: Prescriptions Prior to Admission  Medication Sig Dispense Refill Last Dose  . haloperidol (HALDOL) 5 MG tablet Take 5 mg by mouth 2 (two) times daily.   04/24/2016 at Unknown time  . Melatonin 5 MG TABS Take 5 mg by mouth daily.    04/29/2016 at Unknown time  . traZODone (DESYREL) 100 MG tablet Take 100 mg by mouth at bedtime.   04/28/2016 at Unknown time    Musculoskeletal: Strength & Muscle Tone: within normal limits Gait & Station: normal Patient leans: N/A  Psychiatric Specialty Exam: Physical Exam  Nursing note and vitals reviewed. Constitutional:  I concur with PE done in ED.    Review of Systems  Psychiatric/Behavioral: Positive for depression, hallucinations and suicidal ideas. The patient is nervous/anxious and has insomnia.   All other systems reviewed and are negative.   Blood pressure (!) 91/59, pulse 97, temperature 98 F (36.7 C), temperature source Oral, resp. rate 16, height 6' 0.5" (1.842 m), weight 51.3 kg (113 lb).Body mass index is 15.11 kg/m.  General Appearance: Disheveled  Eye Contact:  Fair  Speech:  Slow  Volume:  Decreased  Mood:  Anxious, Depressed and Dysphoric  Affect:  Congruent  Thought Process:  Goal Directed and Descriptions of Associations: Intact  Orientation:  Full (Time, Place, and Person)  Thought Content:  Hallucinations: Auditory and Rumination  Suicidal Thoughts:  Yes. Is S/P suicide  attempt by OD, continues to ruminate  Homicidal Thoughts:  No  Memory:  Immediate;   Fair Recent;   Fair Remote;   Fair  Judgement:  Impaired  Insight:  Shallow  Psychomotor Activity:  Decreased and Tremor  Concentration:  Concentration: Poor and Attention Span:  Poor  Recall:  [employer] of Knowledge:  Fair  Language:  Fair  Akathisia:  No  Handed:  Right  AIMS (if indicated):     Assets:  Desire for Improvement  ADL's:  Intact  Cognition:  WNL  Sleep:  Number of Hours: 5.5    Treatment Plan Summary:Aldon B Sally is a 45 y.o. [place] male who is single , unemployed ( has applied for disability) , lives with a friend in [place], [place], has a hx of ? Schizoaffective do versus MDD, who presented to [facility] , brought in by his aunt after a suicide attempt by OD on his medications.  Patient today is seen as very emaciated , has difficulty walking and getting out of bed due to having low energy , appears depressed, is S/P suicide attempt by OD - Will start treatment and observe on the unit.  Daily contact with patient to assess and evaluate symptoms and progress in treatment and Medication management   Patient will benefit from inpatient treatment and stabilization.  Estimated length of stay is 5-7 days.  Reviewed past medical records,treatment plan.  Will start a trial of Zyprexa 5 mg po qhs for psychosis, augment the AD as well as to increase appetite. Will restart Celexa 20 mg po daily for affective sx. Will restart Trazodone 100 mg po qhs prn for sleep. Will make available PRN medications as per agitation protocol. Will get Dietician consult for diet recommendations. Will continue to monitor vitals ,medication compliance and treatment side effects while patient is here.  Will monitor for medical issues as well as call consult as needed.  Reviewed labs-  Cbc - wnl, cmp - wnl ,will order EKG for qtc , lipid panel, hba1c, pl as well as vitamin b12, folate, vitamin d.Discussed  getting STD testing - however pt denies IV drug abuse or hx of risky sexual encounters- will reassess. CSW will start working on disposition. CSW to [facility] with patient consent. Patient to participate in therapeutic milieu .      Observation Level/Precautions:  15 minute checks    Psychotherapy:  Individual and group therapy     Consultations:  CSW, Dietician   Discharge Concerns: stability and safety        Physician Treatment Plan for Primary Diagnosis: MDD (major depressive disorder), recurrent, severe, with psychosis (HCC) Long Term Goal(s): Improvement in symptoms so as ready for discharge  Short Term Goals: Ability to disclose and discuss suicidal ideas and Compliance with prescribed medications will improve  Physician Treatment Plan for Secondary Diagnosis: Principal Problem:   MDD (major depressive disorder), recurrent, severe, with psychosis (HCC)  Long Term Goal(s): Improvement in symptoms so as ready for discharge  Short Term Goals: Ability to disclose and discuss suicidal ideas and Compliance with prescribed medications will improve  I certify that inpatient services furnished can reasonably be expected to improve the patient's condition.    Sandeep Delagarza, MD 10/17/201711:43 AM

## 2016-05-03 DIAGNOSIS — E43 Unspecified severe protein-calorie malnutrition: Secondary | ICD-10-CM | POA: Clinically undetermined

## 2016-05-03 LAB — LIPID PANEL
CHOL/HDL RATIO: 2.6 ratio
Cholesterol: 156 mg/dL (ref 0–200)
HDL: 61 mg/dL (ref >40)
LDL CALC: 70 mg/dL (ref 0–99)
TRIGLYCERIDES: 126 mg/dL (ref <150)
VLDL: 25 mg/dL (ref 0–40)

## 2016-05-03 LAB — TSH: TSH: 1.266 u[IU]/mL (ref 0.350–4.500)

## 2016-05-03 LAB — FOLATE: Folate: 13.1 ng/mL (ref >5.9)

## 2016-05-03 LAB — VITAMIN B12: Vitamin B-12: 236 pg/mL (ref 180–914)

## 2016-05-03 MED ORDER — VITAMIN B-12 100 MCG PO TABS
100.0000 ug | ORAL_TABLET | Freq: Every day | ORAL | Status: DC
  Administered 2016-05-03 – 2016-05-09 (×7): 100 ug via ORAL
  Filled 2016-05-03: qty 1
  Filled 2016-05-03: qty 7
  Filled 2016-05-03 (×4): qty 1
  Filled 2016-05-03: qty 7
  Filled 2016-05-03 (×4): qty 1

## 2016-05-03 MED ORDER — TRAZODONE HCL 100 MG PO TABS
100.0000 mg | ORAL_TABLET | Freq: Once | ORAL | Status: AC
Start: 2016-05-03 — End: 2016-05-03
  Administered 2016-05-03: 100 mg via ORAL
  Filled 2016-05-03: qty 1

## 2016-05-03 NOTE — Progress Notes (Signed)
Recreation Therapy Notes  Date: 05/03/16 Time: 1000 Location: [address] Dayroom  Group Topic: Leisure Education  Goal Area(s) Addresses:  Patient will identify positive leisure activities.  Patient will identify one positive benefit of participation in leisure activities.   Behavioral Response: Observed  Intervention: Dry erase board, eraser, dry erase marker, various activities on strips of paper  Activity: Leisure Pictionary.  One patient will come to the board and pick a strip of paper from the container, which contains an activity on it, and draw it on the board.  The remaining patients will attempt to guess what is being drawn on the board.  The person that guesses the picture, will go next.  If there are enough participants, the group can be broken up into teams.  Education:  Leisure Education, Building control surveyorDischarge Planning  Education Outcome: Acknowledges education/In group clarification offered/Needs additional education  Clinical Observations/Feedback: Pt observed as his peers participated in the activity.  Pt was flat during group.  Pt expressed leisure "helps you get exercise".  Pt also stated his favorite leisure activity was basketball.   Caroll RancherMarjette Jaheim Canino, LRT/CTRS     Lillia AbedLindsay, Medardo Hassing A 05/03/2016 11:50 AM

## 2016-05-03 NOTE — Plan of Care (Signed)
Problem: Safety: Goal: Periods of time without injury will increase Outcome: Progressing Q 15 minutes safety checks maintained on and off unit without self injurious behavior / gestures to report at this time.   Problem: Medication: Goal: Compliance with prescribed medication regimen will improve Outcome: Progressing Pt is compliant with current medication regimen. Denies adverse drug reactions at this time.

## 2016-05-03 NOTE — Progress Notes (Signed)
   D: When asked about his day pt stated, "good, can't complaint", during this time pt avoided looking at the writer. Pt did take his scheduled meds without incident, but didn't forward any information to the Clinical research associatewriter. Pt has no questions or concerns.    A:  Support and encouragement was offered. 15 min checks continued for safety.  R: Pt remains safe.

## 2016-05-03 NOTE — Progress Notes (Signed)
D: Pt presents with depressed affect and mood this shift. Guarded, isolative to his room but forwards during conversations. Endorsed passive SI, but verbally contracts for safety. Denies HI, AVH and pain this shift. Rated his depression, anxiety and hopelessness all 5/10 but "I'm alright" . Pt's goal is "feeling better about myself" which he plans to obtained by "trying to stay positive".  A: Emotional support and availability provided to pt. Encouraged to voice concerns and comply with unit routines and treatment regimen. Medications administered as prescribed with verbal education. Q 15 minutes checks done for safety without self harm gestures or outburst to note thus far.  R: Pt receptive to care. Attended scheduled unit groups. Compliant with medications when offered. Denies adverse drug reactions when assessed. Tolerates all PO intake well. Safety maintained on and off unit.

## 2016-05-03 NOTE — [facility] Group Notes (Signed)
[facility] LCSW Group Therapy  05/03/2016 1:30 PM   Type of Therapy:  Group Therapy   Participation Level:  Engaged  Participation Quality:  Attentive  Affect:  Appropriate   Cognitive:  Alert   Insight:  Engaged  Engagement in Therapy:  Improving   Modes of Intervention:  Education, Exploration, Socialization   Summary of Progress/Problems:  Engaged throughout, stayed entire time.  [name] from [employer] was here to tell his story of recovery, inform patients about [employer] and play his guitar.   Jared Adams 05/03/2016 1:30 PM

## 2016-05-03 NOTE — Tx Team (Signed)
Interdisciplinary Treatment and Diagnostic Plan Update  05/03/2016 Time of Session: 11:44 AM  Jared DossJoseph B Adams MRN: [id number]  Principal Diagnosis: MDD (major depressive disorder), recurrent, severe, with psychosis (HCC)  Secondary Diagnoses: Principal Problem:   MDD (major depressive disorder), recurrent, severe, with psychosis (HCC)   Current Medications:  Current Facility-Administered Medications  Medication Dose Route Frequency Provider Last Rate Last Dose  . acetaminophen (TYLENOL) tablet 650 mg  650 mg Oral Q6H PRN Thermon LeylandLaura A Davis, NP      . alum & mag hydroxide-simeth (MAALOX/MYLANTA) 200-200-20 MG/5ML suspension 30 mL  30 mL Oral Q4H PRN Thermon LeylandLaura A Davis, NP      . citalopram (CELEXA) tablet 20 mg  20 mg Oral Daily Jomarie LongsSaramma Eappen, MD   20 mg at 05/03/16 0814  . feeding supplement (ENSURE ENLIVE) (ENSURE ENLIVE) liquid 237 mL  237 mL Oral TID BM Saramma Eappen, MD   237 mL at 05/02/16 2117  . hydrOXYzine (ATARAX/VISTARIL) tablet 25 mg  25 mg Oral Q6H PRN Jomarie LongsSaramma Eappen, MD   25 mg at 05/02/16 2118  . magnesium hydroxide (MILK OF MAGNESIA) suspension 30 mL  30 mL Oral Daily PRN Thermon LeylandLaura A Davis, NP      . nicotine (NICODERM CQ - dosed in mg/24 hours) patch 21 mg  21 mg Transdermal Daily Thermon LeylandLaura A Davis, NP   21 mg at 05/03/16 0816  . OLANZapine (ZYPREXA) tablet 2.5 mg  2.5 mg Oral TID PRN Jomarie LongsSaramma Eappen, MD       Or  . OLANZapine (ZYPREXA) injection 2.5 mg  2.5 mg Intramuscular TID PRN Jomarie LongsSaramma Eappen, MD      . OLANZapine (ZYPREXA) tablet 5 mg  5 mg Oral QHS Jomarie LongsSaramma Eappen, MD   5 mg at 05/02/16 2118  . traZODone (DESYREL) tablet 100 mg  100 mg Oral QHS Jomarie LongsSaramma Eappen, MD   100 mg at 05/02/16 2118    PTA Medications: Prescriptions Prior to Admission  Medication Sig Dispense Refill Last Dose  . haloperidol (HALDOL) 5 MG tablet Take 5 mg by mouth 2 (two) times daily.   04/24/2016 at Unknown time  . Melatonin 5 MG TABS Take 5 mg by mouth daily.    04/29/2016 at Unknown time  .  traZODone (DESYREL) 100 MG tablet Take 100 mg by mouth at bedtime.   04/28/2016 at Unknown time    Treatment Modalities: Medication Management, Group therapy, Case management,  1 to 1 session with clinician, Psychoeducation, Recreational therapy.   Physician Treatment Plan for Primary Diagnosis: MDD (major depressive disorder), recurrent, severe, with psychosis (HCC) Long Term Goal(s): Improvement in symptoms so as ready for discharge  Short Term Goals: Ability to disclose and discuss suicidal ideas  Medication Management: Evaluate patient's response, side effects, and tolerance of medication regimen.  Therapeutic Interventions: 1 to 1 sessions, Unit Group sessions and Medication administration.  Evaluation of Outcomes: Progressing  Physician Treatment Plan for Secondary Diagnosis: Principal Problem:   MDD (major depressive disorder), recurrent, severe, with psychosis (HCC)   Long Term Goal(s): Improvement in symptoms so as ready for discharge  Short Term Goals: Compliance with prescribed medications will improve  Medication Management: Evaluate patient's response, side effects, and tolerance of medication regimen.  Therapeutic Interventions: 1 to 1 sessions, Unit Group sessions and Medication administration.  Evaluation of Outcomes: Progressing   RN Treatment Plan for Primary Diagnosis: MDD (major depressive disorder), recurrent, severe, with psychosis (HCC) Long Term Goal(s): Knowledge of disease and therapeutic regimen to maintain health will improve  Short  Term Goals: Ability to verbalize feelings will improve and Ability to disclose and discuss suicidal ideas  Medication Management: RN will administer medications as ordered by provider, will assess and evaluate patient's response and provide education to patient for prescribed medication. RN will report any adverse and/or side effects to prescribing provider.  Therapeutic Interventions: 1 on 1 counseling sessions,  Psychoeducation, Medication administration, Evaluate responses to treatment, Monitor vital signs and CBGs as ordered, Perform/monitor CIWA, COWS, AIMS and Fall Risk screenings as ordered, Perform wound care treatments as ordered.  Evaluation of Outcomes: Progressing   LCSW Treatment Plan for Primary Diagnosis: MDD (major depressive disorder), recurrent, severe, with psychosis (HCC) Long Term Goal(s): Safe transition to appropriate next level of care at discharge, Engage patient in therapeutic group addressing interpersonal concerns.  Short Term Goals: Engage patient in aftercare planning with referrals and resources  Therapeutic Interventions: Assess for all discharge needs, 1 to 1 time with Social worker, Explore available resources and support systems, Assess for adequacy in community support network, Educate family and significant other(s) on suicide prevention, Complete Psychosocial Assessment, Interpersonal group therapy.  Evaluation of Outcomes: Progressing   Pt has signed a release for PAS[facility]-wil submit request and also LOG, along with coordinating with [place] [facility]   Progress in Treatment: Attending groups: Yes Participating in groups: Yes Taking medication as prescribed: Yes Toleration medication: Yes, no side effects reported at this time Family/Significant other contact made: No Patient understands diagnosis: Yes AEB asking for help with depression Discussing patient identified problems/goals with staff: Yes Medical problems stabilized or resolved: Yes Denies suicidal/homicidal ideation: Yes Issues/concerns per patient self-inventory: None Other: N/A  New problem(s) identified: None identified at this time.   New Short Term/Long Term Goal(s): None identified at this time.   Discharge Plan or Barriers:   Reason for Continuation of Hospitalization: Anxiety Depression Hallucinations Medication stabilization Suicidal ideation   Estimated Length of Stay: 3-5  days  Attendees: Patient: 05/03/2016  11:44 AM  Physician: Jomarie Longs, MD 05/03/2016  11:44 AM  Nursing: Quintella Reichert, RN 05/03/2016  11:44 AM  RN Care Manager: Onnie Boer, RN 05/03/2016  11:44 AM  Social Worker: Richelle Ito 05/03/2016  11:44 AM  Recreational Therapist: Aggie Cosier 05/03/2016  11:44 AM  Other: Tomasita Morrow 05/03/2016  11:44 AM  Other:  05/03/2016  11:44 AM    Scribe for Treatment Team:  Daryel Gerald LCSW 05/03/2016 11:44 AM

## 2016-05-03 NOTE — Progress Notes (Signed)
Pt got up stating he couldn't sleep and wanted anothern Trazodone to help him  Go back to sleep, PA on duty ordered 1x repeat of 100 mg Trazodone.

## 2016-05-03 NOTE — Progress Notes (Signed)
[facility] MD Progress Note  05/03/2016 1:13 PM Jared Adams  MRN:  [id number] Subjective:  Patient states " I am ok."  Objective:Jared B Billingsleyis a 45 y.o.Jared Adams is single , unemployed ( has applied for disability) , lives with a friend in [place], [place], has a hx of ? Schizoaffective do versus MDD, who presented to [facility] , brought in by his aunt after a suicide attempt by OD on his medications.  Patient seen and chart reviewed.Discussed patient with treatment team.  Patient is seen as sitting on his bed, emaciated , lethargic , has difficulty getting out of bed and walking, which he does very cautiously. Pt continues to be hopeless, depressed . Pt also reports that eventhough he is not suicidal right now, he will kill himself if discharged. Pt has been tolerating his medications well, denies ADRs. Support and encouragement provided.   Principal Problem: MDD (major depressive disorder), recurrent, severe, with psychosis (HCC) Diagnosis:   Patient Active Problem List   Diagnosis Date Noted  . MDD (major depressive disorder), recurrent, severe, with psychosis (HCC) [F33.3] 05/02/2016   Total Time spent with patient: 25 minutes  Past Psychiatric History: Please see H&P.   Past Medical History: Please see H&P.  Past Surgical History:  Procedure Laterality Date  . TYMPANOSTOMY TUBE PLACEMENT     Family History:  Family History  Problem Relation Age of Onset  . Schizophrenia Maternal Uncle   . Suicidality Maternal Uncle    Family Psychiatric  History: Please see H&P.  Social History: Please see H&P.  History  Alcohol Use  . Yes    Comment: Occ.     History  Drug Use  . Types: Marijuana    Social History   Social History  . Marital status: Single    Spouse name: N/A  . Number of children: N/A  . Years of education: N/A   Social History Main Topics  . Smoking status: Current Some Day Smoker  . Smokeless tobacco: Never Used  . Alcohol use Yes      Comment: Occ.  . Drug use:     Types: Marijuana  . Sexual activity: Not Asked   Other Topics Concern  . None   Social History Narrative  . None   Additional Social History:                         Sleep: Fair  Appetite:  improving  Current Medications: Current Facility-Administered Medications  Medication Dose Route Frequency Provider Last Rate Last Dose  . acetaminophen (TYLENOL) tablet 650 mg  650 mg Oral Q6H PRN Thermon Leyland, NP      . alum & mag hydroxide-simeth (MAALOX/MYLANTA) 200-200-20 MG/5ML suspension 30 mL  30 mL Oral Q4H PRN Thermon Leyland, NP      . citalopram (CELEXA) tablet 20 mg  20 mg Oral Daily Jomarie Longs, MD   20 mg at 05/03/16 0814  . feeding supplement (ENSURE ENLIVE) (ENSURE ENLIVE) liquid 237 mL  237 mL Oral TID BM Deshonna Trnka, MD   237 mL at 05/03/16 1007  . hydrOXYzine (ATARAX/VISTARIL) tablet 25 mg  25 mg Oral Q6H PRN Jomarie Longs, MD   25 mg at 05/02/16 2118  . magnesium hydroxide (MILK OF MAGNESIA) suspension 30 mL  30 mL Oral Daily PRN Thermon Leyland, NP      . nicotine (NICODERM CQ - dosed in mg/24 hours) patch 21 mg  21 mg Transdermal Daily Vernona Rieger  Jenness CornerA Davis, NP   21 mg at 05/03/16 0816  . OLANZapine (ZYPREXA) tablet 2.5 mg  2.5 mg Oral TID PRN Jomarie LongsSaramma Tinisha Etzkorn, MD       Or  . OLANZapine (ZYPREXA) injection 2.5 mg  2.5 mg Intramuscular TID PRN Jomarie LongsSaramma Stephano Arrants, MD      . OLANZapine (ZYPREXA) tablet 5 mg  5 mg Oral QHS Jomarie LongsSaramma Hiedi Touchton, MD   5 mg at 05/02/16 2118  . traZODone (DESYREL) tablet 100 mg  100 mg Oral QHS Jomarie LongsSaramma Jamahl Lemmons, MD   100 mg at 05/02/16 2118    Lab Results:  Results for orders placed or performed during the hospital encounter of 05/01/16 (from the past 48 hour(s))  TSH     Status: None   Collection Time: 05/03/16  6:23 AM  Result Value Ref Range   TSH 1.266 0.350 - 4.500 uIU/mL    Comment: Performed by a 3rd Generation assay with a functional sensitivity of <=0.01 uIU/mL. Performed at [facility]   Lipid panel     Status: None   Collection Time: 05/03/16  6:23 AM  Result Value Ref Range   Cholesterol 156 0 - 200 mg/dL   Triglycerides 161126 <096<150 mg/dL   HDL 61 >04>40 mg/dL   Total CHOL/HDL Ratio 2.6 RATIO   VLDL 25 0 - 40 mg/dL   LDL Cholesterol 70 0 - 99 mg/dL    Comment:        Total Cholesterol/HDL:CHD Risk Coronary Heart Disease Risk Table                     Men   Women  1/2 Average Risk   3.4   3.3  Average Risk       5.0   4.4  2 X Average Risk   9.6   7.1  3 X Average Risk  23.4   11.0        Use the calculated Patient Ratio above and the CHD Risk Table to determine the patient's CHD Risk.        ATP III CLASSIFICATION (LDL):  <100     mg/dL   Optimal  540-981100-129  mg/dL   Near or Above                    Optimal  130-159  mg/dL   Borderline  191-478160-189  mg/dL   High  >295>190     mg/dL   Very High Performed at [facility]   Vitamin B12     Status: None   Collection Time: 05/03/16  6:23 AM  Result Value Ref Range   Vitamin B-12 236 180 - 914 pg/mL    Comment: (NOTE) This assay is not validated for testing neonatal or myeloproliferative syndrome specimens for Vitamin B12 levels. Performed at [facility]   Folate     Status: None   Collection Time: 05/03/16  6:23 AM  Result Value Ref Range   Folate 13.1 >5.9 ng/mL    Comment: Performed at [facility]    Blood Alcohol level:  No results found for: [facility]  Metabolic Disorder Labs: No results found for: HGBA1C, MPG No results found for: PROLACTIN Lab Results  Component Value Date   CHOL 156 05/03/2016   TRIG 126 05/03/2016   HDL 61 05/03/2016   CHOLHDL 2.6 05/03/2016   VLDL 25 05/03/2016   LDLCALC 70 05/03/2016    Physical Findings: AIMS: Facial and Oral Movements Muscles  of Facial Expression: None, normal Lips and Perioral Area: None, normal Jaw: None, normal Tongue: None, normal,Extremity Movements Upper (arms, wrists, hands, fingers): Minimal (slight hand tremors when  grasping cup of water for meds.) Lower (legs, knees, ankles, toes): None, normal, Trunk Movements Neck, shoulders, hips: None, normal, Overall Severity Severity of abnormal movements (highest score from questions above): None, normal Incapacitation due to abnormal movements: None, normal Patient's awareness of abnormal movements (rate only patient's report): No Awareness, Dental Status Current problems with teeth and/or dentures?: No Does patient usually wear dentures?: No  CIWA:    COWS:     Musculoskeletal: Strength & Muscle Tone: within normal limits Gait & Station: difficulty walking due to low energy  Patient leans: Front  Psychiatric Specialty Exam: Physical Exam  Nursing note and vitals reviewed.   Review of Systems  Psychiatric/Behavioral: Positive for depression and suicidal ideas. The patient is nervous/anxious.   All other systems reviewed and are negative.   Blood pressure (!) 90/50, pulse (!) 125, temperature 98.5 F (36.9 C), temperature source Oral, resp. rate 16, height 6' 0.5" (1.842 m), weight 51.3 kg (113 lb).Body mass index is 15.11 kg/m.  General Appearance: Guarded  Eye Contact:  Minimal  Speech:  Slow  Volume:  Decreased  Mood:  Anxious, Depressed and Dysphoric  Affect:  Depressed  Thought Process:  Goal Directed and Descriptions of Associations: Intact  Orientation:  Full (Time, Place, and Person)  Thought Content:  Hallucinations: Auditory and Ruminationimproving  Suicidal Thoughts:  reports he will kill self if discharged, denies today  Homicidal Thoughts:  No  Memory:  Immediate;   Fair Recent;   Fair Remote;   Fair  Judgement:  Fair  Insight:  Shallow  Psychomotor Activity:  Decreased  Concentration:  Concentration: Fair and Attention Span: Fair  Recall:  [employer] of Knowledge:  Fair  Language:  Fair  Akathisia:  No  Handed:  Right  AIMS (if indicated):     Assets:  Desire for Improvement  ADL's:  Intact  Cognition:  WNL  Sleep:   Number of Hours: 6.75     Treatment Plan Summary:Jared B Billingsleyis a 45 y.o.Jared Adams is single , unemployed ( has applied for disability) , lives with a friend in [place], [place], has a hx of ? Schizoaffective do versus MDD, who presented to [facility] , brought in by his aunt after a suicide attempt by OD on his medications. Patient presents as very lethargic , emaciated , has difficulty walking due to low energy , continues to be hopeless, depressed and suicidal. Will continue treatment.  Daily contact with patient to assess and evaluate symptoms and progress in treatment and Medication management Will continue Zyprexa 5 mg po qhs for psychosis, augment the AD as well as to increase appetite. Will continue Celexa 20 mg po daily for affective sx. Will continue Trazodone 100 mg po qhs prn for sleep. Will make available PRN medications as per agitation protocol.  Dietician consult for diet recommendations- pls see notes. Will continue to monitor vitals ,medication compliance and treatment side effects while patient is here.  Will monitor for medical issues as well as call consult as needed.  Reviewed labs-  Cbc - wnl, cmp - wnl , EKG for qtc- wnl  , lipid panel- wnl , pending hba1c, pl  vitamin b12- 236 - will replace . Folate- wnl , pending vitamin d.Discussed getting STD testing - however pt denies IV drug abuse or hx of risky sexual encounters- will  reassess. CSW will continue working on disposition. CS[facility] consent. Patient to participate in therapeutic milieu .  Jared Damron, MD 05/03/2016, 1:13 PM

## 2016-05-03 NOTE — Progress Notes (Signed)
NUTRITION CONSULT  RD consulted for patient with poor PO intake and weight loss.  INTERVENTION: 1. Educated patient on the importance of nutrition and encouraged intake of food, supplements and beverages. 2. Discussed weight goals. 3. Supplements: Continue Ensure Enlive po TID, each supplement provides 350 kcal and 20 grams of protein 4. Provided "High Calorie, High Protein" Nutrition Therapy handout from [employer]. 5. Provided Ensure and Boost coupons 6. Recommended small, frequent meals (5-6 a day)  NUTRITION DIAGNOSIS: Unintentional weight loss related to sub-optimal intake as evidenced by pt report.   Goal: Pt to meet >/= 90% of their estimated nutrition needs.  Monitor:  PO intake  Assessment:  Spoke with patient who was sleeping prior to visit. Pt reports eating one meal a day PTA. Pt was unable to obtain enough food and states that he would eat more if he had access to food. Provided handout on "High Calorie, High Protein" Nutrition. Also, recommended he drink at least 2 protein supplements a day. Provided Ensure and Boost coupons and reviewed other protein options which may be more affordable.  Noted nutrition labs (B-12, folate): WNL Encouraged pt to continue to [facility] plus the 3 supplements a day.   Pt meets criteria for severe MALNUTRITION in the context of social/environmental circumstances as evidenced by severe muscle and fat depletion and energy intake </=50% for >/= 1 month.  10/17 Pt admitted with schizoaffective disorder. Pt reports not being able to afford food which is why he has not been eating well PTA. Pt has been ordered Ensure supplements BID, will increase to TID given poor PO intake and underweight status. Chart review shows 12 lb weight loss over 2 days, suspect this is an error.     Height: Ht Readings from Last 1 Encounters:  05/01/16 6' 0.5" (1.842 m)    Weight: Wt Readings from Last 1 Encounters:   05/01/16 113 lb (51.3 kg)    Weight Hx: Wt Readings from Last 10 Encounters:  05/01/16 113 lb (51.3 kg)  04/29/16 125 lb (56.7 kg)  09/01/12 124 lb 9.6 oz (56.5 kg)    BMI:  Body mass index is 15.11 kg/m. Pt meets criteria for underweight based on current BMI.  Estimated Nutritional Needs: Kcal: 25-30 kcal/kg Protein: > 1 gram protein/kg Fluid: 1 ml/kcal  Diet Order: Diet regular Room service appropriate? Yes; Fluid consistency: Thin Pt is also offered choice of unit snacks mid-morning and mid-afternoon.  Pt is eating as desired.   Lab results and medications reviewed.   Jared FrancoLindsey [name], MS, RD, LDN Pager: [phone] After Hours Pager: [phone]

## 2016-05-04 DIAGNOSIS — E538 Deficiency of other specified B group vitamins: Secondary | ICD-10-CM | POA: Clinically undetermined

## 2016-05-04 DIAGNOSIS — E559 Vitamin D deficiency, unspecified: Secondary | ICD-10-CM | POA: Clinically undetermined

## 2016-05-04 LAB — HEMOGLOBIN A1C
Hgb A1c MFr Bld: 5.8 % — ABNORMAL HIGH (ref 4.8–5.6)
Mean Plasma Glucose: 120 mg/dL

## 2016-05-04 LAB — VITAMIN D 25 HYDROXY (VIT D DEFICIENCY, FRACTURES): VIT D 25 HYDROXY: 20.5 ng/mL — AB (ref 30.0–100.0)

## 2016-05-04 LAB — PROLACTIN: PROLACTIN: 41.3 ng/mL — AB (ref 4.0–15.2)

## 2016-05-04 MED ORDER — BENZTROPINE MESYLATE 0.5 MG PO TABS
0.5000 mg | ORAL_TABLET | Freq: Every day | ORAL | Status: DC
  Administered 2016-05-04 – 2016-05-08 (×5): 0.5 mg via ORAL
  Filled 2016-05-04 (×6): qty 1
  Filled 2016-05-04 (×2): qty 7

## 2016-05-04 MED ORDER — VITAMIN D3 25 MCG (1000 UNIT) PO TABS
1000.0000 [IU] | ORAL_TABLET | Freq: Every day | ORAL | Status: DC
  Administered 2016-05-04 – 2016-05-09 (×6): 1000 [IU] via ORAL
  Filled 2016-05-04: qty 1
  Filled 2016-05-04: qty 7
  Filled 2016-05-04 (×5): qty 1
  Filled 2016-05-04: qty 7
  Filled 2016-05-04: qty 1

## 2016-05-04 NOTE — Plan of Care (Signed)
Problem: Coping: Goal: Ability to cope will improve Outcome: Progressing Pt stated he was feeling a little better.

## 2016-05-04 NOTE — Progress Notes (Signed)
DAR NOTE: Patient presents with depressed mood and affect. Denies pain, auditory and visual hallucinations.  Rates depression at 5, hopelessness at 5, and anxiety at 5.  Described energy level as normal and concentration as good.  Maintained on routine safety checks.  Medications given as prescribed.  Support and encouragement offered as needed.  Attended group and participated.  States goal for today is "getting better."  Food and fluid intake encouraged. Patient is withdrawn and isolative to his room.  Minimal interaction with staff.

## 2016-05-04 NOTE — Progress Notes (Addendum)
D: Pt denies SI/HI/AVH. Pt is pleasant and cooperative. Pt stated he was feeling a little better. Pt continues to isolate, but pt seen on the milieu sparingly. [place] [employer]. Pt continues to present on the unit very garded and paranoid of other people.   A: Pt was offered support and encouragement. Pt was given scheduled medications. Pt was encourage to attend groups. Q 15 minute checks were done for safety.   R:Pt attends groups and interacts well with peers and staff. Pt is taking medication. Pt receptive to treatment and safety maintained on unit.

## 2016-05-04 NOTE — Progress Notes (Signed)
[facility] MD Progress Note  05/04/2016 1:16 PM JAYLENE SCHROM  MRN:  [id number] Subjective:  Patient states " I am ok. I am still depressed and tired."   Objective:Stepen B Billingsleyis a 45 y.o.Jeanne Ivan is single , unemployed ( has applied for disability) , lives with a friend in [place], [place], has a hx of ? Schizoaffective do versus MDD, who presented to [facility] , brought in by his aunt after a suicide attempt by OD on his medications.  Patient seen and chart reviewed.Discussed patient with treatment team.  Patient is seen as sitting on his bed, emaciated , lethargic , continues to have  difficulty getting out of bed and walking, which he does very cautiously. Pt continues to be hopeless, depressed . Pt also continues to  report that eventhough he is not suicidal right now, he will kill himself if discharged. Pt has been compliant on medications , possible drooling - likely due to zyprexa.Will start Cogentin. Patient with sever malnutrition , vitamin D, B12 deficiency - continue to replace , encourage and support. Support and encouragement provided.   Principal Problem: MDD (major depressive disorder), recurrent, severe, with psychosis (HCC) Diagnosis:   Patient Active Problem List   Diagnosis Date Noted  . Vitamin D deficiency [E55.9] 05/04/2016  . Vitamin B12 deficiency [E53.8] 05/04/2016  . Severe malnutrition (HCC) [E43] 05/03/2016  . MDD (major depressive disorder), recurrent, severe, with psychosis (HCC) [F33.3] 05/02/2016   Total Time spent with patient: 25 minutes  Past Psychiatric History: Please see H&P.   Past Medical History: Please see H&P.  Past Surgical History:  Procedure Laterality Date  . TYMPANOSTOMY TUBE PLACEMENT     Family History:  Family History  Problem Relation Age of Onset  . Schizophrenia Maternal Uncle   . Suicidality Maternal Uncle    Family Psychiatric  History: Please see H&P.  Social History: Please see H&P.  History   Alcohol Use  . Yes    Comment: Occ.     History  Drug Use  . Types: Marijuana    Social History   Social History  . Marital status: Single    Spouse name: N/A  . Number of children: N/A  . Years of education: N/A   Social History Main Topics  . Smoking status: Current Some Day Smoker  . Smokeless tobacco: Never Used  . Alcohol use Yes     Comment: Occ.  . Drug use:     Types: Marijuana  . Sexual activity: Not Asked   Other Topics Concern  . None   Social History Narrative  . None   Additional Social History:                         Sleep: Fair  Appetite:  improving  Current Medications: Current Facility-Administered Medications  Medication Dose Route Frequency Provider Last Rate Last Dose  . acetaminophen (TYLENOL) tablet 650 mg  650 mg Oral Q6H PRN Thermon Leyland, NP      . alum & mag hydroxide-simeth (MAALOX/MYLANTA) 200-200-20 MG/5ML suspension 30 mL  30 mL Oral Q4H PRN Thermon Leyland, NP      . cholecalciferol (VITAMIN D) tablet 1,000 Units  1,000 Units Oral Daily Amelianna Meller, MD      . citalopram (CELEXA) tablet 20 mg  20 mg Oral Daily Jomarie Longs, MD   20 mg at 05/04/16 0804  . feeding supplement (ENSURE ENLIVE) (ENSURE ENLIVE) liquid 237 mL  237 mL Oral  TID BM Jomarie Longs, MD   237 mL at 05/04/16 0807  . hydrOXYzine (ATARAX/VISTARIL) tablet 25 mg  25 mg Oral Q6H PRN Jomarie Longs, MD   25 mg at 05/02/16 2118  . magnesium hydroxide (MILK OF MAGNESIA) suspension 30 mL  30 mL Oral Daily PRN Thermon Leyland, NP      . OLANZapine (ZYPREXA) tablet 2.5 mg  2.5 mg Oral TID PRN Jomarie Longs, MD       Or  . OLANZapine (ZYPREXA) injection 2.5 mg  2.5 mg Intramuscular TID PRN Jomarie Longs, MD      . OLANZapine (ZYPREXA) tablet 5 mg  5 mg Oral QHS Jomarie Longs, MD   5 mg at 05/03/16 2143  . traZODone (DESYREL) tablet 100 mg  100 mg Oral QHS Jomarie Longs, MD   100 mg at 05/03/16 2143  . vitamin B-12 (CYANOCOBALAMIN) tablet 100 mcg  100 mcg Oral  Daily Jomarie Longs, MD   100 mcg at 05/04/16 4098    Lab Results:  Results for orders placed or performed during the hospital encounter of 05/01/16 (from the past 48 hour(s))  TSH     Status: None   Collection Time: 05/03/16  6:23 AM  Result Value Ref Range   TSH 1.266 0.350 - 4.500 uIU/mL    Comment: Performed by a 3rd Generation assay with a functional sensitivity of <=0.01 uIU/mL. Performed at [facility]   Lipid panel     Status: None   Collection Time: 05/03/16  6:23 AM  Result Value Ref Range   Cholesterol 156 0 - 200 mg/dL   Triglycerides 119 <147 mg/dL   HDL 61 >82 mg/dL   Total CHOL/HDL Ratio 2.6 RATIO   VLDL 25 0 - 40 mg/dL   LDL Cholesterol 70 0 - 99 mg/dL    Comment:        Total Cholesterol/HDL:CHD Risk Coronary Heart Disease Risk Table                     Men   Women  1/2 Average Risk   3.4   3.3  Average Risk       5.0   4.4  2 X Average Risk   9.6   7.1  3 X Average Risk  23.4   11.0        Use the calculated Patient Ratio above and the CHD Risk Table to determine the patient's CHD Risk.        ATP III CLASSIFICATION (LDL):  <100     mg/dL   Optimal  956-213  mg/dL   Near or Above                    Optimal  130-159  mg/dL   Borderline  086-578  mg/dL   High  >469     mg/dL   Very High Performed at [facility]   Hemoglobin A1c     Status: Abnormal   Collection Time: 05/03/16  6:23 AM  Result Value Ref Range   Hgb A1c MFr Bld 5.8 (H) 4.8 - 5.6 %    Comment: (NOTE)         Pre-diabetes: 5.7 - 6.4         Diabetes: >6.4         Glycemic control for adults with diabetes: <7.0    Mean Plasma Glucose 120 mg/dL    Comment: (NOTE) Performed At: [facility] [address] [place], [place] [id number] Mila HomerHancock William F MD [phone] Performed at [facility]   Prolactin     Status: Abnormal   Collection Time: 05/03/16  6:23 AM  Result Value Ref Range   Prolactin 41.3 (H) 4.0 - 15.2 ng/mL     Comment: (NOTE) Performed At: [facility] [address] [place], [place] [id number] Mila HomerHancock William F MD ZD:[phone] Performed at [facility]   VITAMIN D 25 Hydroxy (Vit-D Deficiency, Fractures)     Status: Abnormal   Collection Time: 05/03/16  6:23 AM  Result Value Ref Range   Vit D, 25-Hydroxy 20.5 (L) 30.0 - 100.0 ng/mL    Comment: (NOTE) Vitamin D deficiency has been defined by the Institute of Medicine and an Endocrine Society practice guideline as a level of serum 25-OH vitamin D less than 20 ng/mL (1,2). The Endocrine Society went on to further define vitamin D insufficiency as a level between 21 and 29 ng/mL (2). 1. IOM (Institute of Medicine). 2010. Dietary reference   intakes for calcium and D. [place]: The   [employer]. 2. Holick MF, [place], Bischoff-Ferrari HA, et al.   Evaluation, treatment, and prevention of vitamin D   deficiency: an Endocrine Society clinical practice   guideline. JCEM. 2011 Jul; 96(7):1911-30. Performed At: [facility] [address] [place], [place] [id number] Mila HomerHancock William F MD [phone] Performed at [facility]   Vitamin B12     Status: None   Collection Time: 05/03/16  6:23 AM  Result Value Ref Range   Vitamin B-12 236 180 - 914 pg/mL    Comment: (NOTE) This assay is not validated for testing neonatal or myeloproliferative syndrome specimens for Vitamin B12 levels. Performed at [facility]   Folate     Status: None   Collection Time: 05/03/16  6:23 AM  Result Value Ref Range   Folate 13.1 >5.9 ng/mL    Comment: Performed at [facility]    Blood Alcohol level:  No results found for: [facility]  Metabolic Disorder Labs: Lab Results  Component Value Date   HGBA1C 5.8 (H) 05/03/2016   MPG 120 05/03/2016   Lab Results  Component Value Date   PROLACTIN 41.3 (H) 05/03/2016   Lab Results  Component Value Date   CHOL 156 05/03/2016    TRIG 126 05/03/2016   HDL 61 05/03/2016   CHOLHDL 2.6 05/03/2016   VLDL 25 05/03/2016   LDLCALC 70 05/03/2016    Physical Findings: AIMS: Facial and Oral Movements Muscles of Facial Expression: None, normal Lips and Perioral Area: None, normal Jaw: None, normal Tongue: None, normal,Extremity Movements Upper (arms, wrists, hands, fingers): Minimal Lower (legs, knees, ankles, toes): None, normal, Trunk Movements Neck, shoulders, hips: None, normal, Overall Severity Severity of abnormal movements (highest score from questions above): None, normal Incapacitation due to abnormal movements: None, normal Patient's awareness of abnormal movements (rate only patient's report): No Awareness, Dental Status Current problems with teeth and/or dentures?: No Does patient usually wear dentures?: No  CIWA:    COWS:     Musculoskeletal: Strength & Muscle Tone: within normal limits Gait & Station: difficulty walking due to low energy  Patient leans: Front  Psychiatric Specialty Exam: Physical Exam  Nursing note and vitals reviewed.   Review of Systems  Psychiatric/Behavioral: Positive for depression and suicidal ideas. The patient is nervous/anxious.   All other systems reviewed and are negative.   Blood pressure (!) 85/55, pulse (!) 124,  temperature 98.7 F (37.1 C), temperature source Oral, resp. rate 16, height 6' 0.5" (1.842 m), weight 51.3 kg (113 lb).Body mass index is 15.11 kg/m.  General Appearance: Guarded  Eye Contact:  Minimal  Speech:  Slow  Volume:  Decreased  Mood:  Anxious, Depressed and Dysphoric  Affect:  Depressed  Thought Process:  Goal Directed and Descriptions of Associations: Intact  Orientation:  Full (Time, Place, and Person)  Thought Content:  Hallucinations: Auditory and Ruminationimproving  Suicidal Thoughts:  reports he will kill self if discharged, denies today  Homicidal Thoughts:  No  Memory:  Immediate;   Fair Recent;   Fair Remote;   Fair   Judgement:  Fair  Insight:  Shallow  Psychomotor Activity:  Decreased  Concentration:  Concentration: Fair and Attention Span: Fair  Recall:  [employer] of Knowledge:  Fair  Language:  Fair  Akathisia:  No  Handed:  Right  AIMS (if indicated):     Assets:  Desire for Improvement  ADL's:  Intact  Cognition:  WNL  Sleep:  Number of Hours: 6.75     Treatment Plan Summary:Ahaan B Billingsleyis a 45 y.o.Jeanne Ivan is single , unemployed ( has applied for disability) , lives with a friend in [place], [place], has a hx of ? Schizoaffective do versus MDD, who presented to [facility] , brought in by his aunt after a suicide attempt by OD on his medications. Patient presents as very lethargic , emaciated , has difficulty walking due to low energy , continues to be hopeless, depressed and suicidal. Will continue treatment.  Daily contact with patient to assess and evaluate symptoms and progress in treatment and Medication management Will continue Zyprexa 5 mg po qhs for psychosis, augment the AD as well as to increase appetite. Will add Cogentin 0.5 mg po qhs for drooling, likely due to zyprexa. Will continue Celexa 20 mg po daily for affective sx. Will continue Trazodone 100 mg po qhs prn for sleep. Will make available PRN medications as per agitation protocol.  Dietician consult for diet recommendations- pls see notes. Will continue to monitor vitals ,medication compliance and treatment side effects while patient is here.  Will monitor for medical issues as well as call consult as needed.  Reviewed labs-  Cbc - wnl, cmp - wnl , EKG for qtc- wnl  , lipid panel- wnl , pending hba1c, pl  vitamin b12- 236 - will replace . Vitamin D - low - will replace. Folate- wnl .Discussed getting STD testing - however pt denies IV drug abuse or hx of risky sexual encounters- he does agree to getting tested - will order. CSW will continue working on disposition. [facility]. Patient to participate in therapeutic milieu .  Sheldon Sem, MD 05/04/2016, 1:16 PM

## 2016-05-04 NOTE — [facility] Group Notes (Signed)
[facility] Group Notes:  (Counselor/Nursing/MHT/Case Management/Adjunct)  05/04/2016 1:15PM  Type of Therapy:  Group Therapy  Participation Level:  Active  Participation Quality:  Appropriate  Affect:  Flat  Cognitive:  Oriented  Insight:  Improving  Engagement in Group:  Limited  Engagement in Therapy:  Limited  Modes of Intervention:  Discussion, Exploration and Socialization  Summary of Progress/Problems: The topic for group was balance in life.  Pt participated in the discussion about when their life was in balance and out of balance and how this feels.  Pt discussed ways to get back in balance and short term goals they can work on to get where they want to be. Stayed the entire time, engaged throughout.  Stated hs is balanced today because "I'm not feeling tension, and I'm active."  Active to him means not staying in bed and being around others.  Stated he has gotten to this point "because I have decided to go with the flow instead of fighting it."   Colleenfortorth, Jajaira Ruis B 05/04/2016 4:00 PM

## 2016-05-05 LAB — RAPID HIV SCREEN (HIV 1/2 AB+AG)
HIV 1/2 Antibodies: NONREACTIVE
HIV-1 P24 Antigen - HIV24: NONREACTIVE

## 2016-05-05 NOTE — Plan of Care (Signed)
Problem: Coping: Goal: Ability to cope will improve Outcome: Progressing Pt able to verbalize needs with Clinical research associatewriter. Pt attended and engaged in evening wrap up group.

## 2016-05-05 NOTE — Progress Notes (Signed)
Patient ID: Jared DossJoseph B Adams, male   DOB: December 15, 1970, 45 y.o.   MRN: [id number]  D: Patient pacing up hallway on approach. Pt reports he had a good day and denies any adverse effects with medication. Pt denies SI/HI/AVH and pain. Pt attended and engage in evening wrap up group.No behavioral issues noted.  A: Support and encouragement offered as needed. Medications administered as prescribed.  R: Patient cooperative and appropriate on unit. Will continue to monitor patient for safety and stability.

## 2016-05-05 NOTE — Progress Notes (Signed)
DAR NOTE: Patient presents with anxious affect and depressed mood.  Denies pain, auditory and visual hallucinations.  Rates depression at 5, hopelessness at 5, and anxiety at 5.  Described energy level as normal and concentration as good.  Maintained on routine safety checks.  Medications given as prescribed.  Support and encouragement offered as needed.  Attended group and participated.  States goal for today is "to bounce back."  Patient was visible in milieu for activities and therapy.  Staff continues to encourage food and fluid intake.  Patient still withdrawn and isolative to his room.  Minimal interaction with staff and peers.

## 2016-05-05 NOTE — [facility] Group Notes (Addendum)
[facility] LCSW Group Therapy  05/05/2016  1:05 PM  Type of Therapy:  Group therapy  Participation Level:  Active  Participation Quality:  Attentive  Affect:  Flat  Cognitive:  Oriented  Insight:  Limited  Engagement in Therapy:  Limited  Modes of Intervention:  Discussion, Socialization  Summary of Progress/Problems:  Chaplain was here to lead a group on themes of hope and courage. "It takes a lot of courage to take action, ask for help and come to a place like this.  But it has not been bad.  I am happy that I came."  Later stated that sometimes there is a fine line between stupidity and courage, but he feels like he knows the difference and is not overly concerned.  "I mainly need to not be so stubborn and let others know when I am struggling."  Daryel Geraldorth, Barbaraann Avans B 05/05/2016 11:16 AM

## 2016-05-05 NOTE — Progress Notes (Signed)
[facility] Group Notes:  (Nursing/MHT/Case Management/Adjunct)  Date:  05/05/2016  Time:  10:12 PM  Type of Therapy:  Psychoeducational Skills  Participation Level:  Active  Participation Quality:  Appropriate  Affect:  Angry  Cognitive:  Appropriate  Insight:  Good  Engagement in Group:  Engaged  Modes of Intervention:  Education  Summary of Progress/Problems: Patient states he was upset that his family did not come in to see him tonight.   Uriah Trueba S 05/05/2016, 10:12 PM

## 2016-05-05 NOTE — Progress Notes (Signed)
Recreation Therapy Notes  Date: 05/05/16 Time: 1000 Location: 500 Hall Dayroom  Group Topic: Leisure Education  Goal Area(s) Addresses:  Patient will identify positive leisure activities.  Patient will identify one positive benefit of participation in leisure activities.   Behavioral Response: Engaged  Intervention: 20 plastic cups, 2 soft foam balls  Activity: Bowling.  Patients were divided into two teams.  Each patient got two chances to knock down all the "pins".  Patients received a point for each "pin" they knocked.  The first team to reach 100 won.    Education:  Leisure Education, Building control surveyorDischarge Planning  Education Outcome: Acknowledges education/In group clarification offered/Needs additional education  Clinical Observations/Feedback: Pt was flat but was actively participating.  Pt socialized a little with his peers.  Pt seemed to enjoy the activity.  Caroll RancherMarjette Miata Culbreth, LRT/CTRS         Caroll RancherLindsay, Kostantinos Tallman A 05/05/2016 1:04 PM

## 2016-05-05 NOTE — Progress Notes (Signed)
[facility] MD Progress Note  05/05/2016 4:21 PM Jared Adams  MRN:  [id number] Subjective:  Patient states " I am ok. I am still depressed and tired."  But the Zyprexa seems to be working.  I'm also drinking the Ensure."  Objective:Jared B Billingsleyis a 45 y.o.Jared Adams is single , unemployed ( has applied for disability) , lives with a friend in [place], [place], has a hx of ? Schizoaffective do versus MDD, who presented to [facility] , brought in by his aunt after a suicide attempt by OD on his medications.  Patient seen and chart reviewed.Discussed patient with treatment team.  Feelings of hopelessness and worthlessness although present, has improved per patient's report.   He is denying suicide today. Pt has been compliant on medications Patient with sever malnutrition , vitamin D, B12 deficiency - continue to replace , encourage and support. Support and encouragement provided.  Principal Problem: MDD (major depressive disorder), recurrent, severe, with psychosis (HCC) Diagnosis:   Patient Active Problem List   Diagnosis Date Noted  . Vitamin D deficiency [E55.9] 05/04/2016  . Vitamin B12 deficiency [E53.8] 05/04/2016  . Severe malnutrition (HCC) [E43] 05/03/2016  . MDD (major depressive disorder), recurrent, severe, with psychosis (HCC) [F33.3] 05/02/2016   Total Time spent with patient: 25 minutes  Past Psychiatric History: Please see H&P.   Past Medical History: Please see H&P.  Past Surgical History:  Procedure Laterality Date  . TYMPANOSTOMY TUBE PLACEMENT     Family History:  Family History  Problem Relation Age of Onset  . Schizophrenia Maternal Uncle   . Suicidality Maternal Uncle    Family Psychiatric  History: Please see H&P.  Social History: Please see H&P.  History  Alcohol Use  . Yes    Comment: Occ.     History  Drug Use  . Types: Marijuana    Social History   Social History  . Marital status: Single    Spouse name: N/A  . Number of  children: N/A  . Years of education: N/A   Social History Main Topics  . Smoking status: Current Some Day Smoker  . Smokeless tobacco: Never Used  . Alcohol use Yes     Comment: Occ.  . Drug use:     Types: Marijuana  . Sexual activity: Not Asked   Other Topics Concern  . None   Social History Narrative  . None   Additional Social History:                         Sleep: Fair  Appetite:  improving  Current Medications: Current Facility-Administered Medications  Medication Dose Route Frequency Provider Last Rate Last Dose  . acetaminophen (TYLENOL) tablet 650 mg  650 mg Oral Q6H PRN Thermon Leyland, NP      . alum & mag hydroxide-simeth (MAALOX/MYLANTA) 200-200-20 MG/5ML suspension 30 mL  30 mL Oral Q4H PRN Thermon Leyland, NP      . benztropine (COGENTIN) tablet 0.5 mg  0.5 mg Oral QHS Jomarie Longs, MD   0.5 mg at 05/04/16 2142  . cholecalciferol (VITAMIN D) tablet 1,000 Units  1,000 Units Oral Daily Jomarie Longs, MD   1,000 Units at 05/05/16 0820  . citalopram (CELEXA) tablet 20 mg  20 mg Oral Daily Jomarie Longs, MD   20 mg at 05/05/16 0820  . feeding supplement (ENSURE ENLIVE) (ENSURE ENLIVE) liquid 237 mL  237 mL Oral TID BM Saramma Eappen, MD   237 mL  at 05/05/16 1404  . hydrOXYzine (ATARAX/VISTARIL) tablet 25 mg  25 mg Oral Q6H PRN Jomarie LongsSaramma Eappen, MD   25 mg at 05/05/16 0045  . magnesium hydroxide (MILK OF MAGNESIA) suspension 30 mL  30 mL Oral Daily PRN Thermon LeylandLaura A Davis, NP      . OLANZapine [facility](ZYPREXA) tablet 2.5 mg  2.5 mg Oral TID PRN Jomarie LongsSaramma Eappen, MD   2.5 mg at 05/05/16 0045   Or  . OLANZapine (ZYPREXA) injection 2.5 mg  2.5 mg Intramuscular TID PRN Jomarie LongsSaramma Eappen, MD      . OLANZapine (ZYPREXA) tablet 5 mg  5 mg Oral QHS Jomarie LongsSaramma Eappen, MD   5 mg at 05/04/16 2142  . traZODone (DESYREL) tablet 100 mg  100 mg Oral QHS Jomarie LongsSaramma Eappen, MD   100 mg at 05/04/16 2142  . vitamin B-12 (CYANOCOBALAMIN) tablet 100 mcg  100 mcg Oral Daily Jomarie LongsSaramma Eappen, MD   100 mcg  at 05/05/16 0820    Lab Results:  Results for orders placed or performed during the hospital encounter of 05/01/16 (from the past 48 hour(s))  Rapid HIV screen (HIV 1/2 Ab+Ag)     Status: None   Collection Time: 05/05/16  6:29 AM  Result Value Ref Range   HIV-1 P24 Antigen - HIV24 NON REACTIVE NON REACTIVE   HIV 1/2 Antibodies NON REACTIVE NON REACTIVE   Interpretation (HIV Ag Ab)      A non reactive test result means that HIV 1 or HIV 2 antibodies and HIV 1 p24 antigen were not detected in the specimen.    Comment: RESULT CALLED TO, READ BACK BY AND VERIFIED WITH: IWENEKHA,E. AT 8:45 05/05/16 BY THOMPSON,N. [facility]     Blood Alcohol level:  No results found for: [facility]  Metabolic Disorder Labs: Lab Results  Component Value Date   HGBA1C 5.8 (H) 05/03/2016   MPG 120 05/03/2016   Lab Results  Component Value Date   PROLACTIN 41.3 (H) 05/03/2016   Lab Results  Component Value Date   CHOL 156 05/03/2016   TRIG 126 05/03/2016   HDL 61 05/03/2016   CHOLHDL 2.6 05/03/2016   VLDL 25 05/03/2016   LDLCALC 70 05/03/2016    Physical Findings: AIMS: Facial and Oral Movements Muscles of Facial Expression: None, normal Lips and Perioral Area: None, normal Jaw: None, normal Tongue: None, normal,Extremity Movements Upper (arms, wrists, hands, fingers): Minimal Lower (legs, knees, ankles, toes): None, normal, Trunk Movements Neck, shoulders, hips: None, normal, Overall Severity Severity of abnormal movements (highest score from questions above): None, normal Incapacitation due to abnormal movements: None, normal Patient's awareness of abnormal movements (rate only patient's report): No Awareness, Dental Status Current problems with teeth and/or dentures?: No Does patient usually wear dentures?: No  CIWA:    COWS:     Musculoskeletal: Strength & Muscle Tone: within normal limits Gait & Station: difficulty walking due to low energy  Patient  leans: Front  Psychiatric Specialty Exam: Physical Exam  Nursing note and vitals reviewed.   Review of Systems  Psychiatric/Behavioral: Positive for depression and suicidal ideas. The patient is nervous/anxious.   All other systems reviewed and are negative.   Blood pressure 102/62, pulse 87, temperature 98.2 F (36.8 C), resp. rate 16, height 6' 0.5" (1.842 m), weight 51.3 kg (113 lb).Body mass index is 15.11 kg/m.  General Appearance: Guarded  Eye Contact:  Minimal  Speech:  Slow  Volume:  Decreased  Mood:  Anxious, Depressed and Dysphoric  Affect:  Depressed  Thought Process:  Goal Directed and Descriptions of Associations: Intact  Orientation:  Full (Time, Place, and Person)  Thought Content:  Hallucinations: Auditory and Ruminationimproving  Suicidal Thoughts:  Denies  Homicidal Thoughts:  No  Memory:  Immediate;   Fair Recent;   Fair Remote;   Fair  Judgement:  Fair  Insight:  Shallow  Psychomotor Activity:  Decreased  Concentration:  Concentration: Fair and Attention Span: Fair  Recall:  [employer] of Knowledge:  Fair  Language:  Fair  Akathisia:  No  Handed:  Right  AIMS (if indicated):     Assets:  Desire for Improvement  ADL's:  Intact  Cognition:  WNL  Sleep:  Number of Hours: 5.5   Treatment Plan Summary:Jared B Billingsleyis a 45 y.o.Jared Adams is single , unemployed ( has applied for disability) , lives with a friend in [place], [place], has a hx of ? Schizoaffective do versus MDD, who presented to [facility] , brought in by his aunt after a suicide attempt by OD on his medications. Patient presents today less lethargic , emaciated ,feels the Zyprexa is working.  Although weak, he is up and is compliant with meds.  Encouraged to keep up and maintain personal hygiene.  Will continue treatment.  Daily contact with patient to assess and evaluate symptoms and progress in treatment and Medication management Will continue Zyprexa 5 mg po qhs for psychosis,  augment the AD as well as to increase appetite. Will add Cogentin 0.5 mg po qhs for drooling, likely due to zyprexa. Will continue Celexa 20 mg po daily for affective sx. Will continue Trazodone 100 mg po qhs prn for sleep. Will make available PRN medications as per agitation protocol. Dietician consult for diet recommendations- pls see notes. Will continue to monitor vitals ,medication compliance and treatment side effects while patient is here.  Will monitor for medical issues as well as call consult as needed.  Reviewed labs-  Cbc - wnl, cmp - wnl , EKG for qtc- wnl  , lipid panel- wnl , pending hba1c, pl  vitamin b12- 236 - will replace . Vitamin D - low - will replace. Folate- wnl .Discussed getting STD testing - however pt denies IV drug abuse or hx of risky sexual encounters- he does agree to getting tested - will order. CSW will continue working on disposition. [facility]. Patient to participate in therapeutic milieu   Lindwood Qua, NP [facility] 05/05/2016, 4:21 PM

## 2016-05-06 DIAGNOSIS — F251 Schizoaffective disorder, depressive type: Secondary | ICD-10-CM

## 2016-05-06 NOTE — Progress Notes (Signed)
Adult Psychoeducational Group Note  Date:  05/06/2016 Time:  9:46 PM  Group Topic/Focus:  Wrap-Up Group:   The focus of this group is to help patients review their daily goal of treatment and discuss progress on daily workbooks.   Participation Level:  Active  Participation Quality:  Appropriate  Affect:  Appropriate  Cognitive:  Appropriate  Insight: Appropriate  Engagement in Group:  Engaged  Modes of Intervention:  Discussion  Additional Comments:  The patient expressed that he did not attend groups.the patient also said that he rates today a 6. Jared Adams, Jared Adams 05/06/2016, 9:46 PM

## 2016-05-06 NOTE — [facility] Group Notes (Signed)
[facility] Group Notes: (Clinical Social Work)   05/06/2016      Type of Therapy:  Group Therapy   Participation Level:  Did Not Attend despite MHT prompting   Ambrose MantleMareida Grossman-Orr, LCSW 05/06/2016, 1:10 PM

## 2016-05-06 NOTE — Progress Notes (Signed)
DAR NOTE: Patient presents with anxious affect and depressed mood.  Denies pain, auditory and visual hallucinations.  Rates depression at 5, hopelessness at 5, and anxiety at 5.  Maintained on routine safety checks.  Medications given as prescribed.  Support and encouragement offered as needed.  Attended group and participated.  States goal for today is "to take care of myself."  Patient remained withdrawn and isolative.  Minimal interaction with staff.  Food and fluid intake encouraged.

## 2016-05-06 NOTE — Progress Notes (Signed)
D: Pt denies SI/HI/AVH. Pt is pleasant and cooperative. Pt presents very sad/ depressed with flat affect. Pt stated he felt about the same as he did yesterday. Pt seen pacing the unit some of the evening.   A: Pt was offered support and encouragement. Pt was given scheduled medications. Pt was encourage to attend groups. Q 15 minute checks were done for safety.   R:Pt attends groups and interacts well with peers and staff. Pt is taking medication. Pt has no complaints.Pt receptive to treatment and safety maintained on unit.

## 2016-05-06 NOTE — Progress Notes (Signed)
Adult Psychoeducational Group Note  Date:  05/06/2016 Time:  10:34 AM  Group Topic/Focus:  Making Healthy Choices:   The focus of this group is to help patients identify negative/unhealthy choices they were using prior to admission and identify positive/healthier coping strategies to replace them upon discharge.   Participation Level:  Active  Participation Quality:  Appropriate and Attentive  Affect:  Appropriate  Cognitive:  Alert and Appropriate  Insight: Appropriate and Good  Engagement in Group:  Engaged  Modes of Intervention:  Discussion and Education   Mickie Baillizabeth O Iwenekha 05/06/2016, 10:34 AM

## 2016-05-06 NOTE — Progress Notes (Signed)
[facility] MD Progress Note  05/06/2016 2:29 PM Jared Adams  MRN:  [id number] Subjective:  Patient was found in room.  Somnolent. "I feel a little better. I couldn't really collect my thoughts yesterday. Today is more clear but not good enough yet."  Objective: Pt seen and chart reviewed. Pt is alert/oriented x4, calm, cooperative, and appropriate to situation. Pt denies suicidal/homicidal ideation and psychosis. However, he does appear to be internally preoccupied with some apparent thought-blocking and difficulty finding words. Pt is improving and will continue current regimen.   Principal Problem: MDD (major depressive disorder), recurrent, severe, with psychosis (HCC) Diagnosis:   Patient Active Problem List   Diagnosis Date Noted  . MDD (major depressive disorder), recurrent, severe, with psychosis (HCC) [F33.3] 05/02/2016    Priority: High  . Vitamin D deficiency [E55.9] 05/04/2016  . Vitamin B12 deficiency [E53.8] 05/04/2016  . Severe malnutrition (HCC) [E43] 05/03/2016   Total Time spent with patient: 25 minutes  Past Psychiatric History: Please see H&P.   Past Medical History: Please see H&P.  Past Surgical History:  Procedure Laterality Date  . TYMPANOSTOMY TUBE PLACEMENT     Family History:  Family History  Problem Relation Age of Onset  . Schizophrenia Maternal Uncle   . Suicidality Maternal Uncle    Family Psychiatric  History: Please see H&P.  Social History: Please see H&P.  History  Alcohol Use  . Yes    Comment: Occ.     History  Drug Use  . Types: Marijuana    Social History   Social History  . Marital status: Single    Spouse name: N/A  . Number of children: N/A  . Years of education: N/A   Social History Main Topics  . Smoking status: Current Some Day Smoker  . Smokeless tobacco: Never Used  . Alcohol use Yes     Comment: Occ.  . Drug use:     Types: Marijuana  . Sexual activity: Not Asked   Other Topics Concern  . None    Social History Narrative  . None   Additional Social History:                         Sleep: Fair  Appetite:  improving  Current Medications: Current Facility-Administered Medications  Medication Dose Route Frequency Provider Last Rate Last Dose  . acetaminophen (TYLENOL) tablet 650 mg  650 mg Oral Q6H PRN Thermon Leyland, NP      . alum & mag hydroxide-simeth (MAALOX/MYLANTA) 200-200-20 MG/5ML suspension 30 mL  30 mL Oral Q4H PRN Thermon Leyland, NP      . benztropine (COGENTIN) tablet 0.5 mg  0.5 mg Oral QHS Jomarie Longs, MD   0.5 mg at 05/05/16 2106  . cholecalciferol (VITAMIN D) tablet 1,000 Units  1,000 Units Oral Daily Jomarie Longs, MD   1,000 Units at 05/06/16 0746  . citalopram (CELEXA) tablet 20 mg  20 mg Oral Daily Jomarie Longs, MD   20 mg at 05/06/16 0746  . feeding supplement (ENSURE ENLIVE) (ENSURE ENLIVE) liquid 237 mL  237 mL Oral TID BM Saramma Eappen, MD   237 mL at 05/06/16 1341  . hydrOXYzine (ATARAX/VISTARIL) tablet 25 mg  25 mg Oral Q6H PRN Jomarie Longs, MD   25 mg at 05/06/16 0112  . magnesium hydroxide (MILK OF MAGNESIA) suspension 30 mL  30 mL Oral Daily PRN Thermon Leyland, NP      . OLANZapine [facility]) tablet  2.5 mg  2.5 mg Oral TID PRN Jomarie LongsSaramma Eappen, MD   2.5 mg at 05/05/16 0045   Or  . OLANZapine (ZYPREXA) injection 2.5 mg  2.5 mg Intramuscular TID PRN Jomarie LongsSaramma Eappen, MD      . OLANZapine (ZYPREXA) tablet 5 mg  5 mg Oral QHS Jomarie LongsSaramma Eappen, MD   5 mg at 05/05/16 2106  . traZODone (DESYREL) tablet 100 mg  100 mg Oral QHS Jomarie LongsSaramma Eappen, MD   100 mg at 05/05/16 2105  . vitamin B-12 (CYANOCOBALAMIN) tablet 100 mcg  100 mcg Oral Daily Jomarie LongsSaramma Eappen, MD   100 mcg at 05/06/16 [id number]    Lab Results:  Results for orders placed or performed during the hospital encounter of 05/01/16 (from the past 48 hour(s))  Rapid HIV screen (HIV 1/2 Ab+Ag)     Status: None   Collection Time: 05/05/16  6:29 AM  Result Value Ref Range   HIV-1 P24 Antigen - HIV24  NON REACTIVE NON REACTIVE   HIV 1/2 Antibodies NON REACTIVE NON REACTIVE   Interpretation (HIV Ag Ab)      A non reactive test result means that HIV 1 or HIV 2 antibodies and HIV 1 p24 antigen were not detected in the specimen.    Comment: RESULT CALLED TO, READ BACK BY AND VERIFIED WITH: IWENEKHA,E. AT 8:45 05/05/16 BY THOMPSON,N. [facility]     Blood Alcohol level:  No results found for: [facility]  Metabolic Disorder Labs: Lab Results  Component Value Date   HGBA1C 5.8 (H) 05/03/2016   MPG 120 05/03/2016   Lab Results  Component Value Date   PROLACTIN 41.3 (H) 05/03/2016   Lab Results  Component Value Date   CHOL 156 05/03/2016   TRIG 126 05/03/2016   HDL 61 05/03/2016   CHOLHDL 2.6 05/03/2016   VLDL 25 05/03/2016   LDLCALC 70 05/03/2016    Physical Findings: AIMS: Facial and Oral Movements Muscles of Facial Expression: None, normal Lips and Perioral Area: None, normal Jaw: None, normal Tongue: None, normal,Extremity Movements Upper (arms, wrists, hands, fingers): None, normal Lower (legs, knees, ankles, toes): None, normal, Trunk Movements Neck, shoulders, hips: None, normal, Overall Severity Severity of abnormal movements (highest score from questions above): None, normal Incapacitation due to abnormal movements: None, normal Patient's awareness of abnormal movements (rate only patient's report): No Awareness, Dental Status Current problems with teeth and/or dentures?: No Does patient usually wear dentures?: No  CIWA:    COWS:     Musculoskeletal: Strength & Muscle Tone: within normal limits Gait & Station: difficulty walking due to low energy  Patient leans: Front  Psychiatric Specialty Exam: Physical Exam  Nursing note and vitals reviewed.   Review of Systems  Psychiatric/Behavioral: Positive for depression and suicidal ideas. The patient is nervous/anxious.   All other systems reviewed and are negative.   Blood pressure  101/64, pulse 78, temperature 97.9 F (36.6 C), temperature source Oral, resp. rate 16, height 6' 0.5" (1.842 m), weight 51.3 kg (113 lb).Body mass index is 15.11 kg/m.  General Appearance: Guarded  Eye Contact:  Minimal  Speech:  Slow  Volume:  Decreased  Mood:  Anxious, Depressed and Dysphoric  Affect:  Depressed  Thought Process:  Goal Directed and Descriptions of Associations: Intact  Orientation:  Full (Time, Place, and Person)  Thought Content:  Hallucinations: Auditory and Ruminationimproving  Suicidal Thoughts:  Denies  Homicidal Thoughts:  No  Memory:  Immediate;   Fair Recent;   Fair Remote;  Fair  Judgement:  Fair  Insight:  Shallow  Psychomotor Activity:  Decreased  Concentration:  Concentration: Fair and Attention Span: Fair  Recall:  [employer] of Knowledge:  Fair  Language:  Fair  Akathisia:  No  Handed:  Right  AIMS (if indicated):     Assets:  Desire for Improvement  ADL's:  Intact  Cognition:  WNL  Sleep:  Number of Hours: 5.25   Treatment Plan Summary:Amontae B Billingsleyis a 45 y.o.Jeanne Ivan is single , unemployed ( has applied for disability) , lives with a friend in [place], [place], has a hx of ? Schizoaffective do versus MDD, who presented to [facility] , brought in by his aunt after a suicide attempt by OD on his medications.  MDD (major depressive disorder), recurrent, severe, with psychosis (HCC) unstable yet improving, will continue plan as below reviewed on 05/06/16:  Daily contact with patient to assess and evaluate symptoms and progress in treatment and Medication management Will continue Zyprexa 5 mg po qhs for psychosis, augment the AD as well as to increase appetite. Will add Cogentin 0.5 mg po qhs for drooling, likely due to zyprexa. Will continue Celexa 20 mg po daily for affective sx. Will continue Trazodone 100 mg po qhs prn for sleep. Will make available PRN medications as per agitation protocol. Dietician consult for diet  recommendations- pls see notes. Will continue to monitor vitals ,medication compliance and treatment side effects while patient is here.  Will monitor for medical issues as well as call consult as needed.  Reviewed labs-  Cbc - wnl, cmp - wnl , EKG for qtc- wnl  , lipid panel- wnl , pending hba1c, pl  vitamin b12- 236 - will replace . Vitamin D - low - will replace. Folate- wnl .Discussed getting STD testing - however pt denies IV drug abuse or hx of risky sexual encounters- he does agree to getting tested - will order. CSW will continue working on disposition. [facility]. Patient to participate in therapeutic milieu   Beau Fanny, FNP [facility] 05/06/2016, 2:29 PM  I agree with findings and treatment plan of this patient

## 2016-05-07 NOTE — [facility] Group Notes (Signed)
[facility] Group Notes:  (Nursing/MHT/Case Management/Adjunct)  Date:  05/07/2016  Time:  7:36 PM   Type of Therapy:  Psychoeducational Skills  Participation Level:  Active  Participation Quality:  Appropriate  Affect:  Appropriate  Cognitive:  Appropriate  Insight:  Appropriate  Engagement in Group:  Engaged  Modes of Intervention:  Problem-solving  Summary of Progress/Problems:  Group encouraged to surround themselves with positive and healthy group/support system when changing to a healthy life style.    Jared PunchesJane O Rhayne Adams 05/07/2016, 7:36 PM

## 2016-05-07 NOTE — Progress Notes (Signed)
D: Pt denies SI/HI/AVH. Pt is pleasant and cooperative. Pt stated he felt a little anxious about D/C. Pt concerned that he has little to no resources. Pt continues to present guarded on the unit and has minimal interaction with peers.   A: Pt was offered support and encouragement. Pt was given scheduled medications. Pt was encourage to attend groups. Q 15 minute checks were done for safety.    R: Pt is taking medication. Pt has no complaints.Pt receptive to treatment and safety maintained on unit.

## 2016-05-07 NOTE — Progress Notes (Signed)
[facility] MD Progress Note  05/07/2016 4:35 PM LUCHIANO VISCOMI  MRN:  [id number] Subjective:  Patient was found in room.  "I feel so much better. I feel like my meds are working well and I was hoping to go home soon."  Objective: Pt seen and chart reviewed. Pt is alert/oriented x4, calm, cooperative, and appropriate to situation. Pt denies suicidal/homicidal ideation and psychosis. He presents much more lucid today and states that he feels the medication is working well. He is more active on the unit and does not appear to be thought-blocking as he was yesterday.   Principal Problem: MDD (major depressive disorder), recurrent, severe, with psychosis (HCC) Diagnosis:   Patient Active Problem List   Diagnosis Date Noted  . MDD (major depressive disorder), recurrent, severe, with psychosis (HCC) [F33.3] 05/02/2016    Priority: High  . Vitamin D deficiency [E55.9] 05/04/2016  . Vitamin B12 deficiency [E53.8] 05/04/2016  . Severe malnutrition (HCC) [E43] 05/03/2016   Total Time spent with patient: 25 minutes  Past Psychiatric History: Please see H&P.   Past Medical History: Please see H&P.  Past Surgical History:  Procedure Laterality Date  . TYMPANOSTOMY TUBE PLACEMENT     Family History:  Family History  Problem Relation Age of Onset  . Schizophrenia Maternal Uncle   . Suicidality Maternal Uncle    Family Psychiatric  History: Please see H&P.  Social History: Please see H&P.  History  Alcohol Use  . Yes    Comment: Occ.     History  Drug Use  . Types: Marijuana    Social History   Social History  . Marital status: Single    Spouse name: N/A  . Number of children: N/A  . Years of education: N/A   Social History Main Topics  . Smoking status: Current Some Day Smoker  . Smokeless tobacco: Never Used  . Alcohol use Yes     Comment: Occ.  . Drug use:     Types: Marijuana  . Sexual activity: Not Asked   Other Topics Concern  . None   Social History Narrative   . None   Additional Social History:                         Sleep: Fair  Appetite:  improving  Current Medications: Current Facility-Administered Medications  Medication Dose Route Frequency Provider Last Rate Last Dose  . acetaminophen (TYLENOL) tablet 650 mg  650 mg Oral Q6H PRN Thermon Leyland, NP      . alum & mag hydroxide-simeth (MAALOX/MYLANTA) 200-200-20 MG/5ML suspension 30 mL  30 mL Oral Q4H PRN Thermon Leyland, NP      . benztropine (COGENTIN) tablet 0.5 mg  0.5 mg Oral QHS Jomarie Longs, MD   0.5 mg at 05/06/16 2109  . cholecalciferol (VITAMIN D) tablet 1,000 Units  1,000 Units Oral Daily Jomarie Longs, MD   1,000 Units at 05/07/16 0841  . citalopram (CELEXA) tablet 20 mg  20 mg Oral Daily Jomarie Longs, MD   20 mg at 05/07/16 0841  . feeding supplement (ENSURE ENLIVE) (ENSURE ENLIVE) liquid 237 mL  237 mL Oral TID BM Saramma Eappen, MD   237 mL at 05/07/16 1024  . hydrOXYzine (ATARAX/VISTARIL) tablet 25 mg  25 mg Oral Q6H PRN Jomarie Longs, MD   25 mg at 05/06/16 2259  . magnesium hydroxide (MILK OF MAGNESIA) suspension 30 mL  30 mL Oral Daily PRN Thermon Leyland, NP      .  OLANZapine (ZYPREXA) tablet 2.5 mg  2.5 mg Oral TID PRN Jomarie Longs, MD   2.5 mg at 05/06/16 2259   Or  . OLANZapine (ZYPREXA) injection 2.5 mg  2.5 mg Intramuscular TID PRN Jomarie Longs, MD      . OLANZapine (ZYPREXA) tablet 5 mg  5 mg Oral QHS Jomarie Longs, MD   5 mg at 05/06/16 2109  . traZODone (DESYREL) tablet 100 mg  100 mg Oral QHS Jomarie Longs, MD   100 mg at 05/06/16 2109  . vitamin B-12 (CYANOCOBALAMIN) tablet 100 mcg  100 mcg Oral Daily Jomarie Longs, MD   100 mcg at 05/07/16 0841    Lab Results:  No results found for this or any previous visit (from the past 48 hour(s)).  Blood Alcohol level:  No results found for: [facility]  Metabolic Disorder Labs: Lab Results  Component Value Date   HGBA1C 5.8 (H) 05/03/2016   MPG 120 05/03/2016   Lab Results  Component Value Date    PROLACTIN 41.3 (H) 05/03/2016   Lab Results  Component Value Date   CHOL 156 05/03/2016   TRIG 126 05/03/2016   HDL 61 05/03/2016   CHOLHDL 2.6 05/03/2016   VLDL 25 05/03/2016   LDLCALC 70 05/03/2016    Physical Findings: AIMS: Facial and Oral Movements Muscles of Facial Expression: None, normal Lips and Perioral Area: None, normal Jaw: None, normal Tongue: None, normal,Extremity Movements Upper (arms, wrists, hands, fingers): None, normal Lower (legs, knees, ankles, toes): None, normal, Trunk Movements Neck, shoulders, hips: None, normal, Overall Severity Severity of abnormal movements (highest score from questions above): None, normal Incapacitation due to abnormal movements: None, normal Patient's awareness of abnormal movements (rate only patient's report): No Awareness, Dental Status Current problems with teeth and/or dentures?: No Does patient usually wear dentures?: No  CIWA:    COWS:     Musculoskeletal: Strength & Muscle Tone: within normal limits Gait & Station: difficulty walking due to low energy  Patient leans: Front  Psychiatric Specialty Exam: Physical Exam  Nursing note and vitals reviewed.   Review of Systems  Psychiatric/Behavioral: Positive for depression and suicidal ideas. The patient is nervous/anxious.   All other systems reviewed and are negative.   Blood pressure 100/67, pulse 82, temperature 98.2 F (36.8 C), temperature source Oral, resp. rate 16, height 6' 0.5" (1.842 m), weight 51.3 kg (113 lb).Body mass index is 15.11 kg/m.  General Appearance: Guarded  Eye Contact:  Minimal  Speech:  Slow  Volume:  Decreased  Mood:  Anxious, Depressed and Dysphoric  Affect:  Depressed  Thought Process:  Goal Directed and Descriptions of Associations: Intact  Orientation:  Full (Time, Place, and Person)  Thought Content:  Hallucinations: Auditory and Ruminationimproving  Suicidal Thoughts:  Denies  Homicidal Thoughts:  No  Memory:  Immediate;    Fair Recent;   Fair Remote;   Fair  Judgement:  Fair  Insight:  Shallow  Psychomotor Activity:  Decreased  Concentration:  Concentration: Fair and Attention Span: Fair  Recall:  [employer] of Knowledge:  Fair  Language:  Fair  Akathisia:  No  Handed:  Right  AIMS (if indicated):     Assets:  Desire for Improvement  ADL's:  Intact  Cognition:  WNL  Sleep:  Number of Hours: 6.75   Treatment Plan Summary:Billey B Billingsleyis a 45 y.o.Jeanne Ivan is single , unemployed ( has applied for disability) , lives with a friend in [place], [place], has a hx of ? Schizoaffective  do versus MDD, who presented to [facility] , brought in by his aunt after a suicide attempt by OD on his medications.  MDD (major depressive disorder), recurrent, severe, with psychosis (HCC) unstable yet improving, will continue plan as below reviewed on 05/07/16:  Possible discharge Monday or Tuesday  Daily contact with patient to assess and evaluate symptoms and progress in treatment and Medication management Will continue Zyprexa 5 mg po qhs for psychosis, augment the AD as well as to increase appetite. Will add Cogentin 0.5 mg po qhs for drooling, likely due to zyprexa. Will continue Celexa 20 mg po daily for affective sx. Will continue Trazodone 100 mg po qhs prn for sleep. Will make available PRN medications as per agitation protocol. Dietician consult for diet recommendations- pls see notes. Will continue to monitor vitals ,medication compliance and treatment side effects while patient is here.  Will monitor for medical issues as well as call consult as needed.  Reviewed labs-  Cbc - wnl, cmp - wnl , EKG for qtc- wnl  , lipid panel- wnl , pending hba1c, pl  vitamin b12- 236 - will replace . Vitamin D - low - will replace. Folate- wnl .Discussed getting STD testing - however pt denies IV drug abuse or hx of risky sexual encounters- he does agree to getting tested - will order. CSW will continue working on  disposition. CS[facility]. Patient to participate in therapeutic milieu   Beau FannyWithrow, John C, FNP [facility] 05/07/2016, 4:35 PM  I agree with findings and treatment plan of this patient

## 2016-05-07 NOTE — Progress Notes (Signed)
D: Pt presents with depressed affect and mood. Denies SI, HI, AVH and pain when assessed. Pt remains guarded but appropriately engaged with staff and peers. Rates his anxiety 0/10 and depression 5/10 when assessed, stated to writer "I'm ok for now". A: Medications administered as prescribed with verbal education. Support and availability provided to pt. Writer encouraged pt to attend groups and voice concerns.  Q 15 minutes safety checks maintained without outburst thus far or self harm gestures to note.  R: Pt has been medication compliant. Denies adverse drug reactions at this time. Attended unit groups.Tolerates all PO intake well. Remains safe on and off unit without physical distress to note at this time. POC continues without issues.

## 2016-05-07 NOTE — [facility] Group Notes (Signed)
[facility] Group Notes:  (Clinical Social Work)  05/07/2016  11:00AM-12:00PM  Summary of Progress/Problems:  The main focus of today's process group was to listen to a variety of genres of music and to identify that different types of music provoke different responses.  The patient then was able to identify personally what was soothing for them, as well as energizing, as well as how patient can personally use this knowledge in sleep habits, with depression, and with other symptoms.  The patient expressed at the beginning of group the overall feeling of "bored" and at the end of group stated he was "relaxed and mind is clearer."  Type of Therapy:  Music Therapy   Participation Level:  Active  Participation Quality:  Attentive   Affect:  Flat  Cognitive:  Oriented  Insight:  Engaged  Engagement in Therapy:  Engaged  Modes of Intervention:   Activity, Exploration  Ambrose MantleMareida Grossman-Orr, LCSW 05/07/2016

## 2016-05-07 NOTE — Progress Notes (Signed)
Adult Psychoeducational Group Note  Date:  05/07/2016 Time:  9:29 PM  Group Topic/Focus:  Wrap-Up Group:   The focus of this group is to help patients review their daily goal of treatment and discuss progress on daily workbooks.   Participation Level:  Active  Participation Quality:  Appropriate  Affect:  Appropriate  Cognitive:  Appropriate  Insight: Appropriate  Engagement in Group:  Engaged  Modes of Intervention:  Discussion  Additional Comments: Octavio Mannshigpen, Mossie Gilder Lee 05/07/2016, 9:29 PM

## 2016-05-08 MED ORDER — CITALOPRAM HYDROBROMIDE 10 MG PO TABS
25.0000 mg | ORAL_TABLET | Freq: Every day | ORAL | Status: DC
  Administered 2016-05-09: 25 mg via ORAL
  Filled 2016-05-08 (×3): qty 3

## 2016-05-08 MED ORDER — NICOTINE 21 MG/24HR TD PT24
21.0000 mg | MEDICATED_PATCH | Freq: Every day | TRANSDERMAL | Status: DC
  Administered 2016-05-08 – 2016-05-09 (×2): 21 mg via TRANSDERMAL
  Filled 2016-05-08 (×5): qty 1

## 2016-05-08 NOTE — Progress Notes (Signed)
Adult Psychoeducational Group Note  Date:  05/08/2016 Time:  9:28 PM  Group Topic/Focus:  Wrap-Up Group:   The focus of this group is to help patients review their daily goal of treatment and discuss progress on daily workbooks.   Participation Level:  Minimal  Participation Quality:  Appropriate  Affect:  Flat  Cognitive:  Oriented  Insight: Limited  Engagement in Group:  Engaged  Modes of Intervention:  Socialization and Support  Additional Comments:  Patient attended and participated in group tonight. He reported that today he slept. He did went to the GYM with the group and played basket ball. He went for his meals and attended his groups.  Lita MainsFrancis, Marcie Shearon [facility] 05/08/2016, 9:28 PM

## 2016-05-08 NOTE — Tx Team (Signed)
Interdisciplinary Treatment and Diagnostic Plan Update  05/08/2016 Time of Session: 11:22 AM  Jared DossJoseph B Adams MRN: [id number]  Principal Diagnosis: MDD (major depressive disorder), recurrent, severe, with psychosis (HCC)  Secondary Diagnoses: Principal Problem:   MDD (major depressive disorder), recurrent, severe, with psychosis (HCC) Active Problems:   Severe malnutrition (HCC)   Vitamin D deficiency   Vitamin B12 deficiency   Current Medications:  Current Facility-Administered Medications  Medication Dose Route Frequency Provider Last Rate Last Dose  . acetaminophen (TYLENOL) tablet 650 mg  650 mg Oral Q6H PRN Jared LeylandLaura A Davis, NP      . alum & mag hydroxide-simeth (MAALOX/MYLANTA) 200-200-20 MG/5ML suspension 30 mL  30 mL Oral Q4H PRN Jared LeylandLaura A Davis, NP      . benztropine (COGENTIN) tablet 0.5 mg  0.5 mg Oral QHS Jared LongsSaramma Eappen, MD   0.5 mg at 05/07/16 2118  . cholecalciferol (VITAMIN D) tablet 1,000 Units  1,000 Units Oral Daily Jared LongsSaramma Eappen, MD   1,000 Units at 05/08/16 0842  . citalopram (CELEXA) tablet 20 mg  20 mg Oral Daily Jared LongsSaramma Eappen, MD   20 mg at 05/08/16 0842  . feeding supplement (ENSURE ENLIVE) (ENSURE ENLIVE) liquid 237 mL  237 mL Oral TID BM Saramma Eappen, MD   237 mL at 05/08/16 0843  . hydrOXYzine (ATARAX/VISTARIL) tablet 25 mg  25 mg Oral Q6H PRN Jared LongsSaramma Eappen, MD   25 mg at 05/07/16 2337  . magnesium hydroxide (MILK OF MAGNESIA) suspension 30 mL  30 mL Oral Daily PRN Jared LeylandLaura A Davis, NP      . nicotine (NICODERM CQ - dosed in mg/24 hours) patch 21 mg  21 mg Transdermal Daily Jared LongsSaramma Eappen, MD   21 mg at 05/08/16 0925  . OLANZapine (ZYPREXA) tablet 2.5 mg  2.5 mg Oral TID PRN Jared LongsSaramma Eappen, MD   2.5 mg at 05/07/16 2337   Or  . OLANZapine (ZYPREXA) injection 2.5 mg  2.5 mg Intramuscular TID PRN Jared LongsSaramma Eappen, MD      . OLANZapine (ZYPREXA) tablet 5 mg  5 mg Oral QHS Jared LongsSaramma Eappen, MD   5 mg at 05/07/16 2118  . traZODone (DESYREL) tablet 100 mg  100 mg  Oral QHS Jared LongsSaramma Eappen, MD   100 mg at 05/07/16 2118  . vitamin B-12 (CYANOCOBALAMIN) tablet 100 mcg  100 mcg Oral Daily Jared LongsSaramma Eappen, MD   100 mcg at 05/08/16 [id number]    PTA Medications: Prescriptions Prior to Admission  Medication Sig Dispense Refill Last Dose  . haloperidol (HALDOL) 5 MG tablet Take 5 mg by mouth 2 (two) times daily.   04/24/2016 at Unknown time  . Melatonin 5 MG TABS Take 5 mg by mouth daily.    04/29/2016 at Unknown time  . traZODone (DESYREL) 100 MG tablet Take 100 mg by mouth at bedtime.   04/28/2016 at Unknown time    Treatment Modalities: Medication Management, Group therapy, Case management,  1 to 1 session with clinician, Psychoeducation, Recreational therapy.   Physician Treatment Plan for Primary Diagnosis: MDD (major depressive disorder), recurrent, severe, with psychosis (HCC) Long Term Goal(s): Improvement in symptoms so as ready for discharge  Short Term Goals: Ability to disclose and discuss suicidal ideas  Medication Management: Evaluate patient's response, side effects, and tolerance of medication regimen.  Therapeutic Interventions: 1 to 1 sessions, Unit Group sessions and Medication administration.  Evaluation of Outcomes: Progressing  Physician Treatment Plan for Secondary Diagnosis: Principal Problem:   MDD (major depressive disorder), recurrent, severe, with psychosis (HCC)  Active Problems:   Severe malnutrition (HCC)   Vitamin D deficiency   Vitamin B12 deficiency   Long Term Goal(s): Improvement in symptoms so as ready for discharge  Short Term Goals: Compliance with prescribed medications will improve  Medication Management: Evaluate patient's response, side effects, and tolerance of medication regimen.  Therapeutic Interventions: 1 to 1 sessions, Unit Group sessions and Medication administration.  Evaluation of Outcomes: Progressing   RN Treatment Plan for Primary Diagnosis: MDD (major depressive disorder), recurrent, severe,  with psychosis (HCC) Long Term Goal(s): Knowledge of disease and therapeutic regimen to maintain health will improve  Short Term Goals: Ability to verbalize feelings will improve and Ability to disclose and discuss suicidal ideas  Medication Management: RN will administer medications as ordered by provider, will assess and evaluate patient's response and provide education to patient for prescribed medication. RN will report any adverse and/or side effects to prescribing provider.  Therapeutic Interventions: 1 on 1 counseling sessions, Psychoeducation, Medication administration, Evaluate responses to treatment, Monitor vital signs and CBGs as ordered, Perform/monitor CIWA, COWS, AIMS and Fall Risk screenings as ordered, Perform wound care treatments as ordered.  Evaluation of Outcomes: Progressing   LCSW Treatment Plan for Primary Diagnosis: MDD (major depressive disorder), recurrent, severe, with psychosis (HCC) Long Term Goal(s): Safe transition to appropriate next level of care at discharge, Engage patient in therapeutic group addressing interpersonal concerns.  Short Term Goals: Engage patient in aftercare planning with referrals and resources  Therapeutic Interventions: Assess for all discharge needs, 1 to 1 time with Social worker, Explore available resources and support systems, Assess for adequacy in community support network, Educate family and significant other(s) on suicide prevention, Complete Psychosocial Assessment, Interpersonal group therapy.  Evaluation of Outcomes: Progressing   Pt has signed a release for PASRR-wil submit request and also LOG, along with coordinating with [place] DSS; 10/23:  PT has now identified an aunt with whom he will live in [place].  Will be referred to [facility] program as well as community mental health resources.     Progress in Treatment: Attending groups: Yes Participating in groups: Yes Taking medication as  prescribed: Yes Toleration medication: Yes, no side effects reported at this time Family/Significant other contact made: No, will contact aunt when pt can provide phone # Patient understands diagnosis: Yes AEB asking for help with depression Discussing patient identified problems/goals with staff: Yes Medical problems stabilized or resolved: Yes Denies suicidal/homicidal ideation: Yes Issues/concerns per patient self-inventory: None Other: N/A  New problem(s) identified: None identified at this time.   New Short Term/Long Term Goal(s): None identified at this time.   Discharge Plan or Barriers:   Reason for Continuation of Hospitalization: Anxiety Depression Hallucinations Medication stabilization Suicidal ideation   Estimated Length of Stay: 3-5 days  Attendees: Patient: 05/08/2016  11:22 AM  Physician: Jared Longs, MD 05/08/2016  11:22 AM  Nursing: Quintella Reichert, RN; Jan RN 05/08/2016  11:22 AM  RN Care Manager: Onnie Boer, RN 05/08/2016  11:22 AM  Social Worker: Santa Genera LCSW 05/08/2016  11:22 AM  Recreational Therapist: Aggie Cosier 05/08/2016  11:22 AM  Other: Tomasita Morrow 05/08/2016  11:22 AM  Other:  05/08/2016  11:22 AM    Scribe for Treatment Team:  Daryel Gerald LCSW 05/08/2016 11:22 AM

## 2016-05-08 NOTE — [facility] Suicide Risk Assessment (Signed)
[facility] INPATIENT:  Family/Significant Other Suicide Prevention Education  Suicide Prevention Education:  Education Completed;  Illene SilverSandra Stone, TribbeyAunt, [phone],  (name of family member/significant other) has been identified by the patient as the family member/significant other with whom the patient will be residing, and identified as the person(s) who will aid the patient in the event of a mental health crisis (suicidal ideations/suicide attempt).  With written consent from the patient, the family member/significant other has been provided the following suicide prevention education, prior to the and/or following the discharge of the patient.  The suicide prevention education provided includes the following:  Suicide risk factors  Suicide prevention and interventions  National Suicide Hotline telephone number  [facility] assessment telephone number  [facility] Emergency Assistance 911  [facility] and/or Residential Mobile Crisis Unit telephone number  Request made of family/significant other to:  Remove weapons (e.g., guns, rifles, knives), all items previously/currently identified as safety concern.    Remove drugs/medications (over-the-counter, prescriptions, illicit drugs), all items previously/currently identified as a safety concern.  The family member/significant other verbalizes understanding of the suicide prevention education information provided.  The family member/significant other agrees to remove the items of safety concern listed above.  Reviewed discharge information w aunt, confirmed that patient will live w her sister in [place].  States patient has SSI hearing coming up, wants to get patient's medical records.  CSW reviewed options for support in community w aunt as well as discharge plan.    Sallee Langenne C Audria Takeshita 05/08/2016, 12:02 PM

## 2016-05-08 NOTE — Progress Notes (Signed)
D: Pt denies SI/HI/AVH. Pt is pleasant and cooperative. Pt still concerned about his D/C, but pt stated he felt ok because his support was there to help him.   A: Pt was offered support and encouragement. Pt was given scheduled medications. Pt was encourage to attend groups. Q 15 minute checks were done for safety.   R:Pt attends groups  Pt is taking medication. Pt has no complaints.Pt receptive to treatment and safety maintained on unit.

## 2016-05-08 NOTE — Progress Notes (Signed)
D:  Patient's self inventory sheet, patient has poor sleep, sleep medication is helpful.  Good appetite, low energy level, good concentration.  Rated depression, hopeless and anxiety 5.  Denied withdrawals.  Denied SI.  Denied physical problems.  Denied pain.  Goal is to tay home.  Plans to do "my best".  No discharge plans. A:  Medications administered per MD orders.  Emotional support and encouragement given patient. R:  Denied SI and HI, contracts for safety.  Denied A/V hallucinations.  Safety maintained with 15 minute checks.

## 2016-05-08 NOTE — Plan of Care (Signed)
Problem: Education: Goal: Utilization of techniques to improve thought processes will improve Outcome: Progressing Nurse discussed depression/anxiety/coping skills with patient.    

## 2016-05-08 NOTE — Progress Notes (Signed)
[facility] MD Progress Note  05/08/2016 12:02 PM ORPHEUS HAYHURST  MRN:  [id number] Subjective:  Patient states " I am fine. I still feel like I do not have any energy , but I am trying my best."    Objective:Eswin B Billingsleyis a 45 y.o.Jeanne Ivan is single , unemployed ( has applied for disability) , lives with a friend in [place], [place], has a hx of ? Schizoaffective do versus MDD, who presented to [facility] , brought in by his aunt after a suicide attempt by OD on his medications.  Patient seen and chart reviewed.Discussed patient with treatment team.  Patient is seen as sitting on his bed, emaciated , lethargic ,but is improving. Pt continues to have anxiety and sadness . Pt per staff continues to need encouragement and support , although progressing. Patient with severe malnutrition , vitamin D, B12 deficiency - continue to replace. Support and encouragement provided.   Principal Problem: MDD (major depressive disorder), recurrent, severe, with psychosis (HCC) Diagnosis:   Patient Active Problem List   Diagnosis Date Noted  . Vitamin D deficiency [E55.9] 05/04/2016  . Vitamin B12 deficiency [E53.8] 05/04/2016  . Severe malnutrition (HCC) [E43] 05/03/2016  . MDD (major depressive disorder), recurrent, severe, with psychosis (HCC) [F33.3] 05/02/2016   Total Time spent with patient: 25 minutes  Past Psychiatric History: Please see H&P.   Past Medical History: Please see H&P.  Past Surgical History:  Procedure Laterality Date  . TYMPANOSTOMY TUBE PLACEMENT     Family History:  Family History  Problem Relation Age of Onset  . Schizophrenia Maternal Uncle   . Suicidality Maternal Uncle    Family Psychiatric  History: Please see H&P.  Social History: Please see H&P.  History  Alcohol Use  . Yes    Comment: Occ.     History  Drug Use  . Types: Marijuana    Social History   Social History  . Marital status: Single    Spouse name: N/A  . Number of  children: N/A  . Years of education: N/A   Social History Main Topics  . Smoking status: Current Some Day Smoker  . Smokeless tobacco: Never Used  . Alcohol use Yes     Comment: Occ.  . Drug use:     Types: Marijuana  . Sexual activity: Not Asked   Other Topics Concern  . None   Social History Narrative  . None   Additional Social History:                         Sleep: Fair  Appetite:  improving  Current Medications: Current Facility-Administered Medications  Medication Dose Route Frequency Provider Last Rate Last Dose  . acetaminophen (TYLENOL) tablet 650 mg  650 mg Oral Q6H PRN Thermon Leyland, NP      . alum & mag hydroxide-simeth (MAALOX/MYLANTA) 200-200-20 MG/5ML suspension 30 mL  30 mL Oral Q4H PRN Thermon Leyland, NP      . benztropine (COGENTIN) tablet 0.5 mg  0.5 mg Oral QHS Jomarie Longs, MD   0.5 mg at 05/07/16 2118  . cholecalciferol (VITAMIN D) tablet 1,000 Units  1,000 Units Oral Daily Jomarie Longs, MD   1,000 Units at 05/08/16 0842  . [START ON 05/09/2016] citalopram (CELEXA) tablet 25 mg  25 mg Oral Daily Kanylah Muench, MD      . feeding supplement (ENSURE ENLIVE) (ENSURE ENLIVE) liquid 237 mL  237 mL Oral TID BM Hana Trippett,  MD   237 mL at 05/08/16 0843  . hydrOXYzine (ATARAX/VISTARIL) tablet 25 mg  25 mg Oral Q6H PRN Jomarie LongsSaramma Oval Moralez, MD   25 mg at 05/07/16 2337  . magnesium hydroxide (MILK OF MAGNESIA) suspension 30 mL  30 mL Oral Daily PRN Thermon LeylandLaura A Davis, NP      . nicotine (NICODERM CQ - dosed in mg/24 hours) patch 21 mg  21 mg Transdermal Daily Jomarie LongsSaramma Taelor Moncada, MD   21 mg at 05/08/16 0925  . OLANZapine (ZYPREXA) tablet 2.5 mg  2.5 mg Oral TID PRN Jomarie LongsSaramma Ivi Griffith, MD   2.5 mg at 05/07/16 2337   Or  . OLANZapine (ZYPREXA) injection 2.5 mg  2.5 mg Intramuscular TID PRN Jomarie LongsSaramma Nysir Fergusson, MD      . OLANZapine (ZYPREXA) tablet 5 mg  5 mg Oral QHS Jomarie LongsSaramma Tauna Macfarlane, MD   5 mg at 05/07/16 2118  . traZODone (DESYREL) tablet 100 mg  100 mg Oral QHS Jomarie LongsSaramma  Kapono Luhn, MD   100 mg at 05/07/16 2118  . vitamin B-12 (CYANOCOBALAMIN) tablet 100 mcg  100 mcg Oral Daily Jomarie LongsSaramma Kyree Fedorko, MD   100 mcg at 05/08/16 [id number]    Lab Results:  No results found for this or any previous visit (from the past 48 hour(s)).  Blood Alcohol level:  No results found for: [facility]  Metabolic Disorder Labs: Lab Results  Component Value Date   HGBA1C 5.8 (H) 05/03/2016   MPG 120 05/03/2016   Lab Results  Component Value Date   PROLACTIN 41.3 (H) 05/03/2016   Lab Results  Component Value Date   CHOL 156 05/03/2016   TRIG 126 05/03/2016   HDL 61 05/03/2016   CHOLHDL 2.6 05/03/2016   VLDL 25 05/03/2016   LDLCALC 70 05/03/2016    Physical Findings: AIMS: Facial and Oral Movements Muscles of Facial Expression: None, normal Lips and Perioral Area: None, normal Jaw: None, normal Tongue: None, normal,Extremity Movements Upper (arms, wrists, hands, fingers): None, normal Lower (legs, knees, ankles, toes): None, normal, Trunk Movements Neck, shoulders, hips: None, normal, Overall Severity Severity of abnormal movements (highest score from questions above): None, normal Incapacitation due to abnormal movements: None, normal Patient's awareness of abnormal movements (rate only patient's report): No Awareness, Dental Status Current problems with teeth and/or dentures?: No Does patient usually wear dentures?: No  CIWA:    COWS:     Musculoskeletal: Strength & Muscle Tone: within normal limits Gait & Station: difficulty walking due to low energy  Patient leans: Front  Psychiatric Specialty Exam: Physical Exam  Nursing note and vitals reviewed.   Review of Systems  Psychiatric/Behavioral: Positive for depression. The patient is nervous/anxious.   All other systems reviewed and are negative.   Blood pressure (!) 87/62, pulse 82, temperature 97.5 F (36.4 C), temperature source Oral, resp. rate 16, height 6' 0.5" (1.842 m), weight 51.3 kg (113 lb).Body mass index  is 15.11 kg/m.  General Appearance: Guarded  Eye Contact:  Minimal  Speech:  Slow  Volume:  Decreased  Mood:  Anxious, Depressed and Dysphoric on and off  Affect:  Depressed  Thought Process:  Goal Directed and Descriptions of Associations: Intact  Orientation:  Full (Time, Place, and Person)  Thought Content:  Ruminationimproving  Suicidal Thoughts:  No  Homicidal Thoughts:  No  Memory:  Immediate;   Fair Recent;   Fair Remote;   Fair  Judgement:  Fair  Insight:  Shallow  Psychomotor Activity:  Decreased  Concentration:  Concentration: Fair and Attention Span: Fair  Recall:  Jennelle Human of Knowledge:  Fair  Language:  Fair  Akathisia:  No  Handed:  Right  AIMS (if indicated):     Assets:  Desire for Improvement  ADL's:  Intact  Cognition:  WNL  Sleep:  Number of Hours: 6     Treatment Plan Summary:Cuinn B Billingsleyis a 45 y.o.Jeanne Ivan is single , unemployed ( has applied for disability) , lives with a friend in [place], [place], has a hx of ? Schizoaffective do versus MDD, who presented to [facility] , brought in by his aunt after a suicide attempt by OD on his medications. Patient presents as very lethargic , emaciated , he is although improving. Will continue treatment.  Daily contact with patient to assess and evaluate symptoms and progress in treatment and Medication management Will continue Zyprexa 5 mg po qhs for psychosis, augment the AD as well as to increase appetite. Will continue  Cogentin 0.5 mg po qhs for drooling, likely due to zyprexa. Will increase Celexa to 25 mg po daily for affective sx. Will continue Trazodone 100 mg po qhs prn for sleep. Will continue PRN medications as per agitation protocol.  Dietician consult for diet recommendations- pls see notes. Will continue to monitor vitals ,medication compliance and treatment side effects while patient is here.  Will monitor for medical issues as well as call consult as needed.  Reviewed labs-  Cbc -  wnl, cmp - wnl , EKG for qtc- wnl  , lipid panel- wnl , hba1c- 5.8 , pl - 41.3 vitamin b12- 236 - will continue to replace . Vitamin D - low - will continue to  replace. Folate- wnl .Discussed getting STD testing - HIV - NR. CSW will continue working on disposition.  Patient to participate in therapeutic milieu .  Kyriana Yankee, MD 05/08/2016, 12:02 PM

## 2016-05-08 NOTE — Progress Notes (Signed)
Recreation Therapy Notes  Date: 05/08/16 Time: 1000 Location: 500 Hall Dayroom  Group Topic: Self-Esteem  Goal Area(s) Addresses:  Patient will identify positive ways to increase self-esteem. Patient will verbalize benefit of increased self-esteem.  Behavioral Response: Engaged  Intervention: Worksheets, colored pencils  Activity: How I See Me.  LRT introduced the concept of self-esteem to the patients.  Patients were given worksheets with blank faces on them.  Patients were to draw or write how they see themselves on the face.  On the back of the mask, they were to describe how they feel others see them.  Education:  Self-Esteem, Building control surveyorDischarge Planning.   Education Outcome: Acknowledges education/In group clarification offered/Needs additional education  Clinical Observations/Feedback: Pt described himself as happy.  Pt stated others see him as having a good personality, easy-going and a good listener.   Caroll RancherMarjette Desaray Marschner, LRT/CTRS    Caroll RancherLindsay, Leshaun Biebel A 05/08/2016 12:48 PM

## 2016-05-08 NOTE — [facility] Group Notes (Signed)
[facility] LCSW Group Therapy  05/08/2016 1:15pm  Type of Therapy:  Group Therapy vercoming Obstacles  Participation Level:  Reserved  Participation Quality:  Appropriate   Affect:  Appropriate  Cognitive:  Appropriate and Oriented  Insight:  Developing/Improving and Improving  Engagement in Therapy:  Improving  Modes of Intervention:  Discussion, Exploration, Problem-solving and Support  Description of Group:   In this group patients will be encouraged to explore what they see as obstacles to their own wellness and recovery. They will be guided to discuss their thoughts, feelings, and behaviors related to these obstacles. The group will process together ways to cope with barriers, with attention given to specific choices patients can make. Each patient will be challenged to identify changes they are motivated to make in order to overcome their obstacles. This group will be process-oriented, with patients participating in exploration of their own experiences as well as giving and receiving support and challenge from other group members.  Summary of Patient Progress: Pt was reserved in group discussion but participated occasionally without prompting. Pt expresses that negativity and lack of social support are obstacles that he is facing. However, he describes feeling motivated and capable to overcome these obstacles by developing and implementing a plan.   Therapeutic Modalities:   Cognitive Behavioral Therapy Solution Focused Therapy Motivational Interviewing Relapse Prevention Therapy   Chad CordialLauren Carter, LCSWA 05/08/2016 2:23 PM

## 2016-05-09 MED ORDER — TRAZODONE HCL 100 MG PO TABS: 100.0000 mg | ORAL_TABLET | Freq: Every day | ORAL | 0 refills | Status: AC

## 2016-05-09 MED ORDER — NICOTINE 21 MG/24HR TD PT24: 21.0000 mg | MEDICATED_PATCH | Freq: Every day | TRANSDERMAL | 0 refills | Status: DC

## 2016-05-09 MED ORDER — CITALOPRAM HYDROBROMIDE 20 MG PO TABS: 20.0000 mg | ORAL_TABLET | Freq: Every day | ORAL | 0 refills | Status: AC

## 2016-05-09 MED ORDER — BENZTROPINE MESYLATE 0.5 MG PO TABS: 0.5000 mg | ORAL_TABLET | Freq: Every day | ORAL | 0 refills | Status: DC

## 2016-05-09 MED ORDER — CITALOPRAM HYDROBROMIDE 10 MG PO TABS
5.0000 mg | ORAL_TABLET | Freq: Every day | ORAL | Status: DC
  Filled 2016-05-09 (×2): qty 1

## 2016-05-09 MED ORDER — CITALOPRAM HYDROBROMIDE 10 MG PO TABS: 5.0000 mg | ORAL_TABLET | Freq: Every day | ORAL | 0 refills | Status: DC

## 2016-05-09 MED ORDER — HYDROXYZINE HCL 25 MG PO TABS: 25.0000 mg | ORAL_TABLET | Freq: Four times a day (QID) | ORAL | 0 refills | Status: AC | PRN

## 2016-05-09 MED ORDER — CITALOPRAM HYDROBROMIDE 10 MG PO TABS
5.0000 mg | ORAL_TABLET | Freq: Every day | ORAL | Status: DC
  Filled 2016-05-09: qty 1

## 2016-05-09 MED ORDER — CITALOPRAM HYDROBROMIDE 10 MG PO TABS: 10.0000 mg | ORAL_TABLET | Freq: Every day | ORAL | Status: DC

## 2016-05-09 MED ORDER — CITALOPRAM HYDROBROMIDE 20 MG PO TABS
20.0000 mg | ORAL_TABLET | Freq: Every day | ORAL | Status: DC
Start: 2016-05-10 — End: 2016-05-09
  Filled 2016-05-09 (×2): qty 1

## 2016-05-09 MED ORDER — OLANZAPINE 5 MG PO TABS: 5.0000 mg | ORAL_TABLET | Freq: Every day | ORAL | 0 refills | Status: AC

## 2016-05-09 NOTE — Progress Notes (Signed)
Discharge Note: Patient discharged with family member.  Patient denied SI and HI.  Denied A/V hallucinations.  Denied pain.  Suicide prevention information given and discussed with patient who stated he understood and had no questions.  Patient stated he received all his belongings.  All required discharge information given to patient at discharge.  [facility]

## 2016-05-09 NOTE — Clinical Social Work Note (Signed)
CSW was asked to meet w patient and aunt in lobby.  Aunt expressed concern re Discharge SRA and patient assessment.  Wonders why patient was assessed as "normal", feels this is not accurate reflection of patient's status.  Aunt expressed being overwhelmed w caretaking responsibilities, wanted patient to be assigned a caseworker to assist w housing needs.  CSW reiterated discharge plan and involvement of [facility] who will be visiting patient in community and working w access to resources.  Reviewed discharge plan. Aunt expressed no further questions/concerns, states she will talk with community providers and DSS and attempt to assist patient as best she can.  Santa GeneraAnne Sheera Illingworth, LCSW Lead Clinical Social Worker Phone:  [phone]

## 2016-05-09 NOTE — [facility] Suicide Risk Assessment (Signed)
[facility]   Principal Problem: MDD (major depressive disorder), recurrent, severe, with psychosis (HCC) Discharge Diagnoses:  Patient Active Problem List   Diagnosis Date Noted  . Vitamin D deficiency [E55.9] 05/04/2016  . Vitamin B12 deficiency [E53.8] 05/04/2016  . Severe malnutrition (HCC) [E43] 05/03/2016  . MDD (major depressive disorder), recurrent, severe, with psychosis (HCC) [F33.3] 05/02/2016    Total Time spent with patient: 30 minutes  Musculoskeletal: Strength & Muscle Tone: within normal limits Gait & Station: normal Patient leans: N/A  Psychiatric Specialty Exam: Review of Systems  Psychiatric/Behavioral: Negative for depression, hallucinations, substance abuse and suicidal ideas.  All other systems reviewed and are negative.   Blood pressure 106/66, pulse 97, temperature 97.9 F (36.6 C), temperature source Oral, resp. rate 18, height 6' 0.5" (1.842 m), weight 51.3 kg (113 lb).Body mass index is 15.11 kg/m.  General Appearance: Casual  Eye Contact::  Fair  Speech:  Clear and Coherent409  Volume:  Normal  Mood:  Euthymic  Affect:  Appropriate  Thought Process:  Goal Directed and Descriptions of Associations: Intact  Orientation:  Full (Time, Place, and Person)  Thought Content:  Logical  Suicidal Thoughts:  No  Homicidal Thoughts:  No  Memory:  Immediate;   Fair Recent;   Fair Remote;   Fair  Judgement:  Fair  Insight:  Fair  Psychomotor Activity:  Normal  Concentration:  Fair  Recall:  FiservFair  Fund of Knowledge:Fair  Language: Fair  Akathisia:  No  Handed:  Right  AIMS (if indicated): 0    Assets:  Desire for Improvement  Sleep:  Number of Hours: 6.75  Cognition: WNL  ADL's:  Intact   Mental Status Per Nursing Assessment::   On Admission:  NA  Demographic Factors:  Male and Caucasian  Loss Factors: NA  Historical Factors: Impulsivity  Risk Reduction Factors:   Positive therapeutic relationship  Continued  Clinical Symptoms:  Previous Psychiatric Diagnoses and Treatments  Cognitive Features That Contribute To Risk:  None    Suicide Risk:  Minimal: No identifiable suicidal ideation.  Patients presenting with no risk factors but with morbid ruminations; may be classified as minimal risk based on the severity of the depressive symptoms  Follow-up Information    [facility] Follow up on 05/16/2016.   Why:  Hospital discharge follow up appointment on 10/31 at 10:30 AM.  Please call to cancel/reschedule if needed.   Contact information: [address] [place] [address] [phone]        [facility] .   Why:  Referral has been made to this provider for follow up.  Program Manager Wyatt HasteLisa Ellington has been asked for an appointment. Contact information: Phone:  [phone]          Plan Of Care/Follow-up recommendations:  Activity:  no restrictions Diet:  regular Other:  follow up with PMD   Wilburt Messina, MD 05/09/2016, 9:09 AM

## 2016-05-09 NOTE — [facility] Group Notes (Signed)

## 2016-05-09 NOTE — Progress Notes (Signed)
D:  Patient's self inventory sheet, patient has poor sleep, no sleep medication given.  Good appetite, normal energy level, good concentration.  Rated depression, hopeless and anxiety #5.  Denied withdrawals.  Denied SI.  Denied physical problems.  Denied pain.  Goal is "getting better.  Taking one day at a time."  Does have discharge plans. A:  Medications administered per MD orders.  Emotional support and encouragement given patient. R:  Denied SI and HI, contracts for safety.  Denied A/V hallucinations.  Safety maintained with 15 minute checks.

## 2016-05-09 NOTE — Progress Notes (Signed)
  [facility] :  Will you be returning to the same living situation after discharge:  No. will live w aunt At discharge, do you have transportation home?: Yes,  family Do you have the ability to pay for your medications: No.referred to agency that can assist, DSS aware, [place] [facility]  Release of information consent forms completed and in the chart;  Patient's signature needed at discharge.  Patient to Follow up at: Follow-up Information    [facility] Follow up on 05/16/2016.   Why:  Hospital discharge follow up appointment on 10/31 at 10:30 AM.  Please call to cancel/reschedule if needed.   Contact information: [address] [place] [address] [phone]        [facility]. Go on 05/10/2016.   Why:  Referral has been made to this provider for follow up.  Program Manager Wyatt HasteLisa Ellington has been asked for an appointment.  This provider will contact you in the community.  Behavioral health cliniician will contact you when you return home.  Contact information: [address] [place] [address]  Phone:  [phone] Fax:  [phone]          Next level of care provider has access to [facility] Link:no  Safety Planning and Suicide Prevention discussed: Yes,  w aunt  Have you used any form of tobacco in the last 30 days? (Cigarettes, Smokeless Tobacco, Cigars, and/or Pipes): Yes  Has patient been referred to the Quitline?: Patient refused referral  Patient has been referred for addiction treatment: Yes  Sallee Langenne C Emil Klassen 05/09/2016, 10:37 AM

## 2016-05-09 NOTE — Discharge Summary (Signed)
Physician Discharge Summary Note  Patient:  Jared Adams is an 45 y.o., male MRN:  [id number] DOB:  Feb 24, 1971 Patient phone:  [phone] (home)  Patient address:   [address] [place] [address],  Total Time spent with patient: 30 minutes  Date of Admission:  05/01/2016 Date of Discharge: 05/09/2016  Reason for Admission:  overdose  Principal Problem: MDD (major depressive disorder), recurrent, severe, with psychosis [facility]) Discharge Diagnoses: Patient Active Problem List   Diagnosis Date Noted  . Vitamin D deficiency [E55.9] 05/04/2016  . Vitamin B12 deficiency [E53.8] 05/04/2016  . Severe malnutrition (HCC) [E43] 05/03/2016  . MDD (major depressive disorder), recurrent, severe, with psychosis (HCC) [F33.3] 05/02/2016    Past Psychiatric History: see HPI  Past Medical History: History reviewed. No pertinent past medical history.  Past Surgical History:  Procedure Laterality Date  . TYMPANOSTOMY TUBE PLACEMENT     Family History:  Family History  Problem Relation Age of Onset  . Schizophrenia Maternal Uncle   . Suicidality Maternal Uncle    Family Psychiatric  History: see HPI Social History:  History  Alcohol Use  . Yes    Comment: Occ.     History  Drug Use  . Types: Marijuana    Social History   Social History  . Marital status: Single    Spouse name: N/A  . Number of children: N/A  . Years of education: N/A   Social History Main Topics  . Smoking status: Current Some Day Smoker  . Smokeless tobacco: Never Used  . Alcohol use Yes     Comment: Occ.  . Drug use:     Types: Marijuana  . Sexual activity: Not Asked   Other Topics Concern  . None   Social History Narrative  . None    Hospital Course:  Jared Hunger Billingsleyis a 45 y.o.[place] male, has a hx of ? Schizoaffective do versus MDD, who presented to [facility], brought in by his aunt after a suicide attempt by OD on his medications.  Jared Adams was admitted for MDD  (major depressive disorder), recurrent, severe, with psychosis (HCC) and crisis management.  Patient was treated with medications with their indications listed below in detail under Medication List.  Medical problems were identified and treated as needed.  Home medications were restarted as appropriate.  Improvement was monitored by observation and Jared Adams daily report of symptom reduction.  Emotional and mental status was monitored by daily self inventory reports completed by Jared Adams and clinical staff.  Patient reported continued improvement, denied any new concerns.  Patient had been compliant on medications and denied side effects.  Support and encouragement was provided.    Patient encouraged to attend groups to help with recognizing triggers of emotional crises and de-stabilizations.  Patient encouraged to attend group to help identify the positive things in life that would help in dealing with feelings of loss, depression and unhealthy or abusive tendencies.         Jared Adams was evaluated by the treatment team for stability and plans for continued recovery upon discharge.  Patient was offered further treatment options upon discharge including Residential, Intensive Outpatient and Outpatient treatment. Patient will follow up with agency listed below for medication management and counseling.  Encouraged patient to maintain satisfactory support network and home environment.  Advised to adhere to medication compliance and outpatient treatment follow up.  Prescriptions provided.       Employment, transportation, bed availability, health status,  family support, and any pending legal issues were also considered during patient's hospital stay.  Upon completion of this admission the patient was both mentally and medically stable for discharge denying suicidal/homicidal ideation, auditory/visual/tactile hallucinations, delusional thoughts and paranoia.      Physical  Findings: AIMS: Facial and Oral Movements Muscles of Facial Expression: None, normal Lips and Perioral Area: None, normal Jaw: None, normal Tongue: None, normal,Extremity Movements Upper (arms, wrists, hands, fingers): None, normal Lower (legs, knees, ankles, toes): None, normal, Trunk Movements Neck, shoulders, hips: None, normal, Overall Severity Severity of abnormal movements (highest score from questions above): None, normal Incapacitation due to abnormal movements: None, normal Patient's awareness of abnormal movements (rate only patient's report): No Awareness, Dental Status Current problems with teeth and/or dentures?: No Does patient usually wear dentures?: No  CIWA:  CIWA-Ar Total: 1 COWS:  COWS Total Score: 2  Musculoskeletal: Strength & Muscle Tone: within normal limits Gait & Station: normal Patient leans: N/A  Psychiatric Specialty Exam:  SEE MD SRA Physical Exam  Nursing note and vitals reviewed. Psychiatric: He has a normal mood and affect. His behavior is normal. Judgment and thought content normal.    Review of Systems  Constitutional: Negative.   HENT: Negative.   Eyes: Negative.   Respiratory: Negative.   Cardiovascular: Negative.   Gastrointestinal: Negative.   Genitourinary: Negative.   Musculoskeletal: Negative.   Skin: Negative.   Neurological: Negative.   Endo/Heme/Allergies: Negative.   Psychiatric/Behavioral: Negative.     Blood pressure (!) 89/68, pulse (!) 102, temperature 98.4 F (36.9 C), temperature source Oral, resp. rate 18, height 6' 0.5" (1.842 m), weight 51.3 kg (113 lb).Body mass index is 15.11 kg/m.  Have you used any form of tobacco in the last 30 days? (Cigarettes, Smokeless Tobacco, Cigars, and/or Pipes): Yes  Has this patient used any form of tobacco in the last 30 days? (Cigarettes, Smokeless Tobacco, Cigars, and/or Pipes) Yes, N/A  Blood Alcohol level:  No results found for: [facility]  Metabolic Disorder Labs:  Lab Results   Component Value Date   HGBA1C 5.8 (H) 05/03/2016   MPG 120 05/03/2016   Lab Results  Component Value Date   PROLACTIN 41.3 (H) 05/03/2016   Lab Results  Component Value Date   CHOL 156 05/03/2016   TRIG 126 05/03/2016   HDL 61 05/03/2016   CHOLHDL 2.6 05/03/2016   VLDL 25 05/03/2016   LDLCALC 70 05/03/2016    See Psychiatric Specialty Exam and Suicide Risk Assessment completed by Attending Physician prior to discharge.  Discharge destination:  Home  Is patient on multiple antipsychotic therapies at discharge:  No   Has Patient had three or more failed trials of antipsychotic monotherapy by history:  No  Recommended Plan for Multiple Antipsychotic Therapies: NA     Medication List    STOP taking these medications   haloperidol 5 MG tablet Commonly known as:  HALDOL   Melatonin 5 MG Tabs     TAKE these medications     Indication  benztropine 0.5 MG tablet Commonly known as:  COGENTIN Take 1 tablet (0.5 mg total) by mouth at bedtime.  Indication:  Extrapyramidal Reaction caused by Medications   citalopram 10 MG tablet Commonly known as:  CELEXA Take 0.5 tablets (5 mg total) by mouth daily.  Indication:  Depression   citalopram 20 MG tablet Commonly known as:  CELEXA Take 1 tablet (20 mg total) by mouth daily. Start taking on:  05/10/2016  Indication:  Depression  hydrOXYzine 25 MG tablet Commonly known as:  ATARAX/VISTARIL Take 1 tablet (25 mg total) by mouth every 6 (six) hours as needed for anxiety.  Indication:  Anxiety Neurosis   nicotine 21 mg/24hr patch Commonly known as:  NICODERM CQ - dosed in mg/24 hours Place 1 patch (21 mg total) onto the skin daily. Start taking on:  05/10/2016  Indication:  Nicotine Addiction   OLANZapine 5 MG tablet Commonly known as:  ZYPREXA Take 1 tablet (5 mg total) by mouth at bedtime.  Indication:  mood stabilization   traZODone 100 MG tablet Commonly known as:  DESYREL Take 1 tablet (100 mg total) by  mouth at bedtime.  Indication:  Trouble Sleeping      Follow-up Information    [facility] Follow up on 05/16/2016.   Why:  Hospital discharge follow up appointment on 10/31 at 10:30 AM.  Please call to cancel/reschedule if needed.   Contact information: [address] [place] [address] [phone]        [facility]. Go on 05/10/2016.   Why:  Referral has been made to this provider for follow up.  Program Manager Wyatt HasteLisa Ellington has been asked for an appointment.  This provider will contact you in the community.  Behavioral health cliniician will contact you when you return home.  Contact information: [address] [place] [address]  Phone:  [phone] Fax:  [phone]          Follow-up recommendations:  Activity:  as tol Diet:  as tol  Comments:  1.  Take all your medications as prescribed.   2.  Report any adverse side effects to outpatient provider. 3.  Patient instructed to not use alcohol or illegal drugs while on prescription medicines. 4.  In the event of worsening symptoms, instructed patient to call 911, the crisis hotline or go to nearest emergency room for evaluation of symptoms.  Signed: Lindwood QuaSheila May Rajiv Parlato, NP [facility] 05/09/2016, 1:31 PM

## 2016-05-09 NOTE — Tx Team (Signed)
Interdisciplinary Treatment and Diagnostic Plan Update  05/09/2016 Time of Session: 9:00 AM Jared Adams MRN: [id number]  Principal Diagnosis: MDD (major depressive disorder), recurrent, severe, with psychosis ([place])  Secondary Diagnoses: Principal Problem:   MDD (major depressive disorder), recurrent, severe, with psychosis ([place]) Active Problems:   Severe malnutrition ([place])   Vitamin D deficiency   Vitamin B12 deficiency   Current Medications:  Current Facility-Administered Medications  Medication Dose Route Frequency Provider Last Rate Last Dose  . acetaminophen (TYLENOL) tablet 650 mg  650 mg Oral Q6H PRN Niel Hummer, NP      . alum & mag hydroxide-simeth (MAALOX/MYLANTA) 200-200-20 MG/5ML suspension 30 mL  30 mL Oral Q4H PRN Niel Hummer, NP      . benztropine (COGENTIN) tablet 0.5 mg  0.5 mg Oral QHS Ursula Alert, MD   0.5 mg at 05/08/16 2113  . cholecalciferol (VITAMIN D) tablet 1,000 Units  1,000 Units Oral Daily Ursula Alert, MD   1,000 Units at 05/09/16 0734  . [START ON 05/10/2016] citalopram (CELEXA) tablet 20 mg  20 mg Oral Daily Kerrie Buffalo, NP      . Derrill Memo ON 05/10/2016] citalopram (CELEXA) tablet 5 mg  5 mg Oral Daily Saramma Eappen, MD      . feeding supplement (ENSURE ENLIVE) (ENSURE ENLIVE) liquid 237 mL  237 mL Oral TID BM Saramma Eappen, MD   237 mL at 05/09/16 0735  . hydrOXYzine (ATARAX/VISTARIL) tablet 25 mg  25 mg Oral Q6H PRN Ursula Alert, MD   25 mg at 05/07/16 2337  . magnesium hydroxide (MILK OF MAGNESIA) suspension 30 mL  30 mL Oral Daily PRN Niel Hummer, NP      . nicotine (NICODERM CQ - dosed in mg/24 hours) patch 21 mg  21 mg Transdermal Daily Ursula Alert, MD   21 mg at 05/09/16 0736  . OLANZapine (ZYPREXA) tablet 2.5 mg  2.5 mg Oral TID PRN Ursula Alert, MD   2.5 mg at 05/07/16 2337   Or  . OLANZapine (ZYPREXA) injection 2.5 mg  2.5 mg Intramuscular TID PRN Ursula Alert, MD      . OLANZapine (ZYPREXA) tablet 5 mg  5 mg Oral  QHS Ursula Alert, MD   5 mg at 05/08/16 2113  . traZODone (DESYREL) tablet 100 mg  100 mg Oral QHS Ursula Alert, MD   100 mg at 05/08/16 2113  . vitamin B-12 (CYANOCOBALAMIN) tablet 100 mcg  100 mcg Oral Daily Ursula Alert, MD   100 mcg at 05/09/16 0735    PTA Medications: Prescriptions Prior to Admission  Medication Sig Dispense Refill Last Dose  . haloperidol (HALDOL) 5 MG tablet Take 5 mg by mouth 2 (two) times daily.   04/24/2016 at Unknown time  . Melatonin 5 MG TABS Take 5 mg by mouth daily.    04/29/2016 at Unknown time  . traZODone (DESYREL) 100 MG tablet Take 100 mg by mouth at bedtime.   04/28/2016 at Unknown time    Treatment Modalities: Medication Management, Group therapy, Case management,  1 to 1 session with clinician, Psychoeducation, Recreational therapy.   Physician Treatment Plan for Primary Diagnosis: MDD (major depressive disorder), recurrent, severe, with psychosis ([place]) Long Term Goal(s): Improvement in symptoms so as ready for discharge  Short Term Goals: Ability to disclose and discuss suicidal ideas  Medication Management: Evaluate patient's response, side effects, and tolerance of medication regimen.  Therapeutic Interventions: 1 to 1 sessions, Unit Group sessions and Medication administration.  Evaluation of Outcomes:  Met  Physician Treatment Plan for Secondary Diagnosis: Principal Problem:   MDD (major depressive disorder), recurrent, severe, with psychosis ([place]) Active Problems:   Severe malnutrition ([place])   Vitamin D deficiency   Vitamin B12 deficiency   Long Term Goal(s): Improvement in symptoms so as ready for discharge  Short Term Goals: Compliance with prescribed medications will improve  Medication Management: Evaluate patient's response, side effects, and tolerance of medication regimen.  Therapeutic Interventions: 1 to 1 sessions, Unit Group sessions and Medication administration.  Evaluation of Outcomes: Met   RN Treatment  Plan for Primary Diagnosis: MDD (major depressive disorder), recurrent, severe, with psychosis ([place]) Long Term Goal(s): Knowledge of disease and therapeutic regimen to maintain health will improve  Short Term Goals: Ability to verbalize feelings will improve and Ability to disclose and discuss suicidal ideas  Medication Management: RN will administer medications as ordered by provider, will assess and evaluate patient's response and provide education to patient for prescribed medication. RN will report any adverse and/or side effects to prescribing provider.  Therapeutic Interventions: 1 on 1 counseling sessions, Psychoeducation, Medication administration, Evaluate responses to treatment, Monitor vital signs and CBGs as ordered, Perform/monitor CIWA, COWS, AIMS and Fall Risk screenings as ordered, Perform wound care treatments as ordered.  Evaluation of Outcomes: Met   LCSW Treatment Plan for Primary Diagnosis: MDD (major depressive disorder), recurrent, severe, with psychosis ([place]) Long Term Goal(s): Safe transition to appropriate next level of care at discharge, Engage patient in therapeutic group addressing interpersonal concerns.  Short Term Goals: Engage patient in aftercare planning with referrals and resources  Therapeutic Interventions: Assess for all discharge needs, 1 to 1 time with Social worker, Explore available resources and support systems, Assess for adequacy in community support network, Educate family and significant other(s) on suicide prevention, Complete Psychosocial Assessment, Interpersonal group therapy.  Evaluation of Outcomes: Met   Pt has signed a release for PASRR-wil submit request and also LOG, along with coordinating with [place]; 10/23:  PT has now identified an aunt with whom he will live in [place].  Will be referred to [facility] program as well as community mental health resources.    [facility] has  contacted aunt and mother - will connect w patient in community and assist w needed resources.     Progress in Treatment: Attending groups: Yes Participating in groups: Yes Taking medication as prescribed: Yes Toleration medication: Yes, no side effects reported at this time Family/Significant other contact made: CSW contacted aunt Patient understands diagnosis: Yes AEB asking for help with depression Discussing patient identified problems/goals with staff: Yes Medical problems stabilized or resolved: Yes Denies suicidal/homicidal ideation: Yes Issues/concerns per patient self-inventory: None Other: N/A  New problem(s) identified: None identified at this time.   New Short Term/Long Term Goal(s): None identified at this time.   Discharge Plan or Barriers:   Reason for Continuation of Hospitalization: Anxiety Depression Hallucinations Medication stabilization Suicidal ideation   Estimated Length of Stay:DC today Attendees: Patient: 05/09/2016  10:38 AM  Physician: Ursula Alert, MD 05/09/2016  10:38 AM  Nursing: Grayland Ormond, RN; Opal Sidles RN 05/09/2016  10:38 AM  RN Care Manager:  05/09/2016  10:38 AM  Social Worker: Edwyna Shell LCSW 05/09/2016  10:38 AM  Recreational Therapist: Winfield Cunas 05/09/2016  10:38 AM  Other: Norberto Sorenson, [place] 05/09/2016  10:38 AM  Other:  05/09/2016  10:38 AM    Scribe for Treatment Team:  Edwyna Shell LCSW 05/09/2016 10:38 AM

## 2016-05-09 NOTE — Progress Notes (Signed)
Recreation Therapy Notes  Date: 05/09/16 Time: 1000 Location: 500 Hall Dayroom  Group Topic: Wellness  Goal Area(s) Addresses:  Patient will define components of whole wellness. Patient will verbalize benefit of whole wellness.  Behavioral Response: Engaged  Intervention: [employer] ball, chairs  Activity: Keep it [employer] Volleyball.  Patients were arranged in a circle.  Patients were to pass to ball to each other while the LRT counted the number of hits on the ball.  The ball was allowed to bounce off of the floor but could not roll to a stop.  If the ball came to a stop, the count would start over.  Education: Wellness, Building control surveyorDischarge Planning.   Education Outcome: Acknowledges education/In group clarification offered/Needs additional education.   Clinical Observations/Feedback: Pt was bright and active during group.  Pt was social with peers and was suggesting ways to keep the ball from stopping.  Pt stated he liked the group and was fun.   Caroll RancherMarjette Olanda Boughner, LRT/CTRS      Lillia AbedLindsay, Srah Ake A 05/09/2016 11:40 AM

## 2016-05-09 NOTE — Plan of Care (Signed)
Problem: [employer] Participation in Recreation Therapeutic Interventions Goal: STG-Patient will identify at least five coping skills for ** STG: Coping Skills - Patient will be able to identify at least 5 coping skills to improve attitude by conclusion of recreation therapy tx  Outcome: Completed/Met Date Met: 05/09/16 Pt was able to identify coping skills after completion of leisure education recreational therapy session.  Victorino Sparrow, LRT/CTRS

## 2016-05-29 NOTE — Congregational Nurse Program (Signed)
Congregational Nurse Program Note  Date of Encounter: 05/25/2016  Past Medical History: No past medical history on file.  Encounter Details:     CNP Questionnaire - 05/25/16 1335      Patient Demographics   Is this a new or existing patient? New   Patient is considered a/an Not Applicable   Race Caucasian/White     Patient Assistance   Location of Patient Assistance [facility]   Patient's financial/insurance status Low Income;Self-Pay (Uninsured)   Uninsured Patient ([place]/Care Connects) Yes   Patient referred to apply for the following financial assistance [facility]   Food insecurities addressed Not Applicable   Transportation assistance No   Assistance securing medications No   Educational health offerings Behavioral health;Chronic disease;Health literacy;Navigating the healthcare system;Medications     Encounter Details   Primary purpose of visit Chronic Illness/Condition Visit;Education/Health Concerns;Navigating the Healthcare System   Was an Emergency Department visit averted? Not Applicable   Does patient have a medical provider? No   Patient referred to Area Agency;Establish PCP;Clinic   Was a mental health screening completed? (GAINS tool) No   Does patient have dental issues? No   Does patient have vision issues? No   Does your patient have an abnormal blood pressure today? No   Since previous encounter, have you referred patient for abnormal blood pressure that resulted in a new diagnosis or medication change? No   Does your patient have an abnormal blood glucose today? No   Since previous encounter, have you referred patient for abnormal blood glucose that resulted in a new diagnosis or medication change? No   Was there a life-saving intervention made? No     [facility]. Client chief concern is needing a primary Care Provider for medical care. [facility], but has had difficulty paying for  his medications. He has disability pending , but has been denied previously. No medicaid. Lives with his Aunt.  Current medications: citalopram 20 mg 1/2 tablet Olanzapine 5 mg 1 tab at bedtime Hydroxyzine HCL 25 mg every 6 hours as needed. Benztropine 0.5 mg 1 tab at bedtime Trazodone 100 mg 1.5 tablet at bedtime.  Client states that [facility] and that he has [facility] and they helped him fill his last presciptions. Sharlet SalinaSharon Neville with integrated care contacted with client present in order to maintain continuity of care. It was determined that someone from [employer] is working with  Medication assistance. Client adamant that he does not want to [facility] who has [facility] program if possible. Client reassured that provisions for medication assistance for his mental health medications are being pursued.  RN discussed options for medical care without insurance and [facility] due to [place] to where he lives. Referral made and appointment secured for 06/01/16 at 10:00 am. Will follow up with client after appointment . Client will [facility] with [facility] for [facility] health services as well as intergrated care for assistance with follow up with re-filed medicaid claim.  Client states understanding.

## 2016-06-01 ENCOUNTER — Ambulatory Visit: Payer: Self-pay | Admitting: Physician Assistant

## 2016-06-01 ENCOUNTER — Encounter: Payer: Self-pay | Admitting: Physician Assistant

## 2016-06-01 VITALS — BP 92/58 | HR 105 | Temp 99.0°F | Ht 72.0 in | Wt 137.5 lb

## 2016-06-01 DIAGNOSIS — F39 Unspecified mood [affective] disorder: Secondary | ICD-10-CM

## 2016-06-01 DIAGNOSIS — F17210 Nicotine dependence, cigarettes, uncomplicated: Secondary | ICD-10-CM | POA: Insufficient documentation

## 2016-06-01 DIAGNOSIS — E559 Vitamin D deficiency, unspecified: Secondary | ICD-10-CM

## 2016-06-01 DIAGNOSIS — Z131 Encounter for screening for diabetes mellitus: Secondary | ICD-10-CM

## 2016-06-01 LAB — GLUCOSE, POCT (MANUAL RESULT ENTRY): POC GLUCOSE: 105 mg/dL — AB (ref 70–99)

## 2016-06-01 MED ORDER — VITAMIN D (ERGOCALCIFEROL) 1.25 MG (50000 UNIT) PO CAPS: 50000.0000 [IU] | ORAL_CAPSULE | ORAL | 1 refills | Status: DC

## 2016-06-01 NOTE — Progress Notes (Signed)
BP (!) 92/58 (BP Location: Left Arm, Patient Position: Sitting, Cuff Size: Normal)   Pulse (!) 105   Temp 99 F (37.2 C) (Other (Comment))   Ht 6' (1.829 m)   Wt 137 lb 8 oz (62.4 kg)   SpO2 97%   BMI 18.65 kg/m    Subjective:    Patient ID: Jared DossJoseph B Recker, male    DOB: 1971-01-21, [age] y.o.   MRN: [id number]  HPI: Jared Adams is a 45 y.o. male presenting on 06/01/2016 for New Patient (Initial Visit)   HPI Recent admit for suicide attempt by OD on his meds. Pt was [facility] 10/[facility].   He is been pt of [facility] for about 2 years.  Pt has put on 10 pounds since he got out of the hospital  Pt says he is being sent medassist application in the mail that he is supposed to complete by himself and return.  Relevant past medical, surgical, family and social history reviewed and updated as indicated. Interim medical history since our last visit reviewed. Allergies and medications reviewed and updated.  Meds: Benztropine (cogentin) Citalopram Hydroxyzine Olanzapine (zyprexa) trazodone  Review of Systems  Constitutional: Positive for appetite change. Negative for chills, diaphoresis, fatigue, fever and unexpected weight change.  HENT: Positive for dental problem. Negative for congestion, drooling, ear pain, facial swelling, hearing loss, mouth sores, sneezing, sore throat, trouble swallowing and voice change.   Eyes: Negative for pain, discharge, redness, itching and visual disturbance.  Respiratory: Negative for cough, choking, shortness of breath and wheezing.   Cardiovascular: Negative for chest pain, palpitations and leg swelling.  Gastrointestinal: Negative for abdominal pain, blood in stool, constipation, diarrhea and vomiting.  Endocrine: Negative for cold intolerance, heat intolerance and polydipsia.  Genitourinary: Negative for decreased urine volume, dysuria and hematuria.  Musculoskeletal: Negative for arthralgias, back pain and  gait problem.  Skin: Negative for rash.  Allergic/Immunologic: Negative for environmental allergies.  Neurological: Positive for headaches. Negative for seizures, syncope and light-headedness.  Hematological: Negative for adenopathy.  Psychiatric/Behavioral: Negative for agitation, dysphoric mood and suicidal ideas. The patient is not nervous/anxious.     Per HPI unless specifically indicated above     Objective:    BP (!) 92/58 (BP Location: Left Arm, Patient Position: Sitting, Cuff Size: Normal)   Pulse (!) 105   Temp 99 F (37.2 C) (Other (Comment))   Ht 6' (1.829 m)   Wt 137 lb 8 oz (62.4 kg)   SpO2 97%   BMI 18.65 kg/m   Wt Readings from Last 3 Encounters:  06/01/16 137 lb 8 oz (62.4 kg)  05/25/16 138 lb (62.6 kg)  04/29/16 125 lb (56.7 kg)    Physical Exam  Constitutional: He is oriented to person, place, and time. Vital signs are normal.  Pt overly thin  HENT:  Head: Normocephalic and atraumatic.  Mouth/Throat: Oropharynx is clear and moist. No oropharyngeal exudate.  Eyes: Conjunctivae and EOM are normal. Pupils are equal, round, and reactive to light.  Neck: Neck supple. No thyromegaly present.  Cardiovascular: Normal rate and regular rhythm.   Pulmonary/Chest: Effort normal and breath sounds normal. He has no wheezes. He has no rales.  Abdominal: Soft. Bowel sounds are normal. He exhibits no mass. There is no hepatosplenomegaly. There is no tenderness.  Musculoskeletal: He exhibits no edema.  Lymphadenopathy:    He has no cervical adenopathy.  Neurological: He is alert and oriented to person, place, and time.  Skin: Skin is warm and dry. No rash noted.  Psychiatric: He has a normal mood and affect. His behavior is normal. Thought content normal.  Vitals reviewed.   Results for orders placed or performed in visit on 06/01/16  POCT Glucose (CBG)  Result Value Ref Range   POC Glucose 105 (A) 70 - 99 mg/dl      Assessment & Plan:   Encounter Diagnoses   Name Primary?  . Vitamin D deficiency Yes  . Cigarette nicotine dependence without complication   . Mood disorder (HCC)   . Screening for diabetes mellitus     -reviewed labs from recent hospitalization -will Rx Vit d supplement -will get pt signed up for Med[facility]ist -Push good nutrition- handout  Eating healthy on a budget. Discussed eating and nutrition to put on some weight at length with pt -[facility] -counseled smoking cessation -follow up in 3 month.  RTO sooner prn

## 2016-06-01 NOTE — Patient Instructions (Signed)
Eating Healthy on a Budget Introduction There are many ways to save money at the grocery store and continue to eat healthy. You can be successful if you plan your meals according to your budget, purchase according to your budget and grocery list, and prepare food yourself. How can I buy more food on a limited budget? Plan  Plan meals and snacks according to a grocery list and budget you create.  Look for recipes where you can cook once and make enough food for two meals.  Include meals that will "stretch" more expensive foods such as stews, casseroles, and stir-fry dishes.  Make a grocery list and make sure to bring it with you to the store. If you have a smart phone, you could use your phone to create your shopping list. Purchase  When grocery shopping, buy only the items on your grocery list and go only to the areas of the store that have the items on your list. Prepare  Some meal items can be prepared in advance. Pre-cook on days when you have extra time.  Make extra food (such as by doubling recipes) and freeze the extras in meal-sized containers or in individual portions for fast meals and snacks.  Use leftovers in your meal plan for the week.  Try some meatless meals or try "no cook" meals like salads.  When you come home from the grocery store, wash and prepare your fruits and vegetables so they are ready to use and eat. This will help reduce food waste. How can I buy more food on a limited budget? Try these tips the next time you go shopping:  Alto[employer] store brands or generic brands.  Use coupons only for foods and brands you normally buy. Avoid buying items you wouldn't normally buy simply because they are on sale.  Check online and in newspapers for weekly deals.  Buy healthy items from the bulk bins when available, such as herbs, spices, flours, pastas, nuts, and dried fruit.  Buy fruits and vegetables that are in season. Prices are usually lower on in-season  produce.  Compare and contrast different items. You can do this by looking at the unit price on the price tag. Use it to compare different brands and sizes to find out which item is the best deal.  Choose naturally low-cost healthy items, such as carrots, potatoes, apples, bananas, and oranges. Dried or canned beans are a low-cost protein source.  Buy in bulk and freeze extra food. Items you can buy in bulk include meats, fish, poultry, frozen fruits, and frozen vegetables.  Limit the purchase of prepared or "ready-to-eat" foods, such as pre-cut fruits and vegetables and pre-made salads.  If possible, shop around to discover which grocery store offers the best prices. Some stores charge much more than other stores for the same items.  Do not shop when you are hungry. If you shop while hungry, It may be hard to stick to your list and budget.  Stick to your list and resist impulse buys. Treat your list as your official plan for the week.  Buy a variety of vegetables and fruit by purchasing fresh, frozen, and canned items.  Look beyond eye level. Foods at eye level (adult or child eye level) are more expensive. Look at the top and bottom shelves for deals.  Be efficient with your time when shopping. The more time you spend at the store, the more money you are likely to spend.  Consider other retailers such as dollar stores, larger  wholesale stores, local fruit and vegetable stands, and farmers markets. What are some tips for less expensive food substitutions? When choosing more expensive foods like meats and dairy, try these tips to save money:  Choose cheaper cuts of meat, such as bone-in chicken thighs and drumsticks instead skinless and boneless chicken. When you are ready to prepare the chicken, you can remove the skin yourself to make it healthier.  Choose lean meats like chicken or [place]. When choosing ground beef, make sure it is lean ground beef (92% lean, 8% fat). If you do buy a  fattier ground beef, drain the fat before eating.  Buy dried beans and peas, such as lentils, split peas, or kidney beans.  For seafood, choose canned tuna, salmon, or sardines.  Eggs are a low-cost source of protein.  Buy the larger tubs of yogurt instead of individual-sized containers.  Choose water instead of sodas and other sweetened beverages.  Skip buying chips, cookies, and other "junk food". These items are usually expensive, high in calories, and low in nutritional value. How can I prepare the foods I buy in the healthiest way? Practice these tips for cooking foods in the healthiest way to reduce excess fat and calorie intake:  Steam, saute, grill, or bake foods instead of frying them.  Make sure half your plate is filled with fruits or vegetables. Choose from fresh, frozen, or canned fruits and vegetables. If eating canned, remember to rinse them before eating. This will remove any excess salt added for packaging.  Trim all fat from meat before cooking. Remove the skin from chicken or [place].  Spoon off fat from meat dishes once they have been chilled in the refrigerator and the fat has hardened on the top.  Use skim milk, low-fat milk, or evaporated skim milk when making cream sauces, soups, or puddings.  Substitute low-fat yogurt, sour cream, or cottage cheese for sour cream and mayonnaise in dips and dressings.  Try lemon juice, herbs, or spices to season food instead of salt, butter, or margarine. This information is not intended to replace advice given to you by your health care provider. Make sure you discuss any questions you have with your health care provider. Document Released: 03/06/2014 Document Revised: 01/21/2016 Document Reviewed: 02/03/2014  2017 Elsevier

## 2016-08-22 ENCOUNTER — Other Ambulatory Visit: Payer: Self-pay | Admitting: Student

## 2016-08-22 DIAGNOSIS — E559 Vitamin D deficiency, unspecified: Secondary | ICD-10-CM

## 2016-08-25 LAB — VITAMIN D 25 HYDROXY (VIT D DEFICIENCY, FRACTURES): VIT D 25 HYDROXY: 37 ng/mL (ref 30–100)

## 2016-08-31 ENCOUNTER — Ambulatory Visit: Payer: Self-pay | Admitting: Physician Assistant

## 2016-09-06 ENCOUNTER — Ambulatory Visit: Payer: Self-pay | Admitting: Physician Assistant

## 2016-09-06 ENCOUNTER — Encounter: Payer: Self-pay | Admitting: Physician Assistant

## 2016-09-06 VITALS — BP 108/72 | HR 80 | Temp 97.9°F | Ht 72.0 in | Wt 137.0 lb

## 2016-09-06 DIAGNOSIS — E43 Unspecified severe protein-calorie malnutrition: Secondary | ICD-10-CM

## 2016-09-06 DIAGNOSIS — F17210 Nicotine dependence, cigarettes, uncomplicated: Secondary | ICD-10-CM

## 2016-09-06 DIAGNOSIS — F39 Unspecified mood [affective] disorder: Secondary | ICD-10-CM

## 2016-09-06 DIAGNOSIS — E559 Vitamin D deficiency, unspecified: Secondary | ICD-10-CM

## 2016-09-06 NOTE — Progress Notes (Signed)
BP 108/72   Pulse 80   Temp 97.9 F (36.6 C)   Ht 6' (1.829 m)   Wt 137 lb (62.1 kg)   SpO2 96%   BMI 18.58 kg/m    Subjective:    Patient ID: Jared Adams, male    DOB: 10/10/1970, 46 y.o.   MRN: [id number]  HPI: Jared Adams is a 46 y.o. male presenting on 09/06/2016 for Follow-up   HPI   Pt is going to [facility] Pt is without complaint today  Relevant past medical, surgical, family and social history reviewed and updated as indicated. Interim medical history since our last visit reviewed. Allergies and medications reviewed and updated.  Current Outpatient Prescriptions:  .  citalopram (CELEXA) 20 MG tablet, Take 1 tablet (20 mg total) by mouth daily., Disp: 30 tablet, Rfl: 0 .  hydrOXYzine (ATARAX/VISTARIL) 25 MG tablet, Take 1 tablet (25 mg total) by mouth every 6 (six) hours as needed for anxiety., Disp: 30 tablet, Rfl: 0 .  OLANZapine (ZYPREXA) 5 MG tablet, Take 1 tablet (5 mg total) by mouth at bedtime., Disp: 30 tablet, Rfl: 0 .  traZODone (DESYREL) 100 MG tablet, Take 1 tablet (100 mg total) by mouth at bedtime., Disp: 30 tablet, Rfl: 0 .  Vitamin D, Ergocalciferol, (DRISDOL) 50000 units CAPS capsule, Take 1 capsule (50,000 Units total) by mouth every 7 (seven) days., Disp: 12 capsule, Rfl: 1   Review of Systems  Constitutional: Negative for appetite change, chills, diaphoresis, fatigue, fever and unexpected weight change.  HENT: Negative for congestion, dental problem, drooling, ear pain, facial swelling, hearing loss, mouth sores, sneezing, sore throat, trouble swallowing and voice change.   Eyes: Negative for pain, discharge, redness, itching and visual disturbance.  Respiratory: Negative for cough, choking, shortness of breath and wheezing.   Cardiovascular: Negative for chest pain, palpitations and leg swelling.  Gastrointestinal: Negative for abdominal pain, blood in stool, constipation, diarrhea and vomiting.  Endocrine: Negative for cold  intolerance, heat intolerance and polydipsia.  Genitourinary: Negative for decreased urine volume, dysuria and hematuria.  Musculoskeletal: Negative for arthralgias, back pain and gait problem.  Skin: Negative for rash.  Allergic/Immunologic: Negative for environmental allergies.  Neurological: Positive for headaches. Negative for seizures, syncope and light-headedness.  Hematological: Negative for adenopathy.  Psychiatric/Behavioral: Positive for dysphoric mood. Negative for agitation and suicidal ideas. The patient is nervous/anxious.     Per HPI unless specifically indicated above     Objective:    BP 108/72   Pulse 80   Temp 97.9 F (36.6 C)   Ht 6' (1.829 m)   Wt 137 lb (62.1 kg)   SpO2 96%   BMI 18.58 kg/m   Wt Readings from Last 3 Encounters:  09/06/16 137 lb (62.1 kg)  06/01/16 137 lb 8 oz (62.4 kg)  05/25/16 138 lb (62.6 kg)    Physical Exam  Constitutional: He is oriented to person, place, and time. Vital signs are normal.  Very thin  HENT:  Head: Normocephalic and atraumatic.  Neck: Neck supple.  Cardiovascular: Normal rate and regular rhythm.   Pulmonary/Chest: Effort normal and breath sounds normal. He has no wheezes.  Abdominal: Soft. Bowel sounds are normal. There is no hepatosplenomegaly. There is no tenderness.  Musculoskeletal: He exhibits no edema.  Lymphadenopathy:    He has no cervical adenopathy.  Neurological: He is alert and oriented to person, place, and time.  Skin: Skin is warm and dry.  Psychiatric: He has a normal mood and  affect. His behavior is normal.  Vitals reviewed.   Results for orders placed or performed in visit on 08/22/16  Vitamin D (25 hydroxy)  Result Value Ref Range   Vit D, 25-Hydroxy 37 30 - 100 ng/mL      Assessment & Plan:    Encounter Diagnoses  Name Primary?  . Mood disorder (HCC) Yes  . Severe malnutrition (HCC)   . Vitamin D deficiency   . Cigarette nicotine dependence without complication     -reviewed  labs with pt -pt to Continue vitamin d -pt Given ifobt for colon cancer screening in light of his weight although pt states he has never been big and his depression also likely component -Refer to dietician -recommended to pt to use ensure or boost for supplementation -counceled smoking cessation -F/u 3 months.  RTO sooner prn

## 2016-09-06 NOTE — Patient Instructions (Signed)
Steps to Quit Smoking Smoking tobacco can be bad for your health. It can also affect almost every organ in your body. Smoking puts you and people around you at risk for many serious long-lasting (chronic) diseases. Quitting smoking is hard, but it is one of the best things that you can do for your health. It is never too late to quit. What are the benefits of quitting smoking? When you quit smoking, you lower your risk for getting serious diseases and conditions. They can include:  Lung cancer or lung disease.  Heart disease.  Stroke.  Heart attack.  Not being able to have children (infertility).  Weak bones (osteoporosis) and broken bones (fractures). If you have coughing, wheezing, and shortness of breath, those symptoms may get better when you quit. You may also get sick less often. If you are pregnant, quitting smoking can help to lower your chances of having a baby of low birth weight. What can I do to help me quit smoking? Talk with your doctor about what can help you quit smoking. Some things you can do (strategies) include:  Quitting smoking totally, instead of slowly cutting back how much you smoke over a period of time.  Going to in-person counseling. You are more likely to quit if you go to many counseling sessions.  Using resources and support systems, such as:  Online chats with a counselor.  Phone quitlines.  Printed self-help materials.  Support groups or group counseling.  Text messaging programs.  Mobile phone apps or applications.  Taking medicines. Some of these medicines may have nicotine in them. If you are pregnant or breastfeeding, do not take any medicines to quit smoking unless your doctor says it is okay. Talk with your doctor about counseling or other things that can help you. Talk with your doctor about using more than one strategy at the same time, such as taking medicines while you are also going to in-person counseling. This can help make quitting  easier. What things can I do to make it easier to quit? Quitting smoking might feel very hard at first, but there is a lot that you can do to make it easier. Take these steps:  Talk to your family and friends. Ask them to support and encourage you.  Call phone quitlines, reach out to support groups, or work with a counselor.  Ask people who smoke to not smoke around you.  Avoid places that make you want (trigger) to smoke, such as:  Bars.  Parties.  Smoke-break areas at work.  Spend time with people who do not smoke.  Lower the stress in your life. Stress can make you want to smoke. Try these things to help your stress:  Getting regular exercise.  Deep-breathing exercises.  Yoga.  Meditating.  Doing a body scan. To do this, close your eyes, focus on one area of your body at a time from head to toe, and notice which parts of your body are tense. Try to relax the muscles in those areas.  Download or buy apps on your mobile phone or tablet that can help you stick to your quit plan. There are many free apps, such as QuitGuide from the CDC (Centers for Disease Control and Prevention). You can find more support from smokefree.gov and other websites. This information is not intended to replace advice given to you by your health care provider. Make sure you discuss any questions you have with your health care provider. Document Released: 04/29/2009 Document Revised: 02/29/2016 Document   Reviewed: 11/17/2014 Elsevier Interactive Patient Education  2017 Elsevier Inc.  

## 2016-09-07 ENCOUNTER — Other Ambulatory Visit: Payer: Self-pay | Admitting: Physician Assistant

## 2016-09-07 DIAGNOSIS — Z1211 Encounter for screening for malignant neoplasm of colon: Secondary | ICD-10-CM

## 2016-09-07 LAB — IFOBT (OCCULT BLOOD): IMMUNOLOGICAL FECAL OCCULT BLOOD TEST: NEGATIVE

## 2016-09-14 ENCOUNTER — Encounter: Payer: Self-pay | Admitting: Physician Assistant

## 2016-10-10 ENCOUNTER — Encounter: Payer: Self-pay | Attending: Physician Assistant | Admitting: Nutrition

## 2016-10-10 VITALS — Ht 74.0 in | Wt 131.8 lb

## 2016-10-10 DIAGNOSIS — R636 Underweight: Secondary | ICD-10-CM

## 2016-10-10 NOTE — Progress Notes (Signed)
Medical Nutrition Therapy:  Appt start time: 1330 end time:  1430.   Assessment:  Primary concerns today: Underweight and malnutrition.Marland Kitchen. LIves with his Aunt. PMH: Depression, problems sleeping, anxiety.  [facility]. He or is cousin does the cooking in the home. He eats 2-3 meals per day. Usual weight 1 year ago  Was about 135-140 lbs. Not working. Appplied for disability and medicaid. Walked here for his appt. Will look into using [facility] for appts. Family usually bring him to appts. Smokes 1 pack per day.   Get food stamps $192 per month. Admits he doesn't eat healthy and willing to improve his food choices when he has access to the better food choices. He admits to a good appetite but just always have food to eat. Eats a lot of ramen noodles and cheaper foods. BMI 16.92. Undeweight. He has bitemporal wasting, bony prominents, dry brittle hair, sunken eye sockets and carious teeth. Signs of muscle wasting and malnutrition.    Limited access to quantity and quality foods is his main reason for being underweight. However, he is picky with some foods and doesn't like a lot of vegetables. Drinks a lot of sodas. Will work on cutting down on smoking and sodas and put his money towards food.    Diet is insuffient in calories, protein and vitamin and minerals to meet his needs. Has no transportation to get to food pantries.   Just started going to the free clinic for his medical care. Doesn't have insurance. Trying to get disability.   CMP Latest Ref Rng & Units 04/29/2016  Glucose 65 - 99 mg/dL 161(W113(H)  BUN 6 - 20 mg/dL 13  Creatinine 9.600.61 - 4.541.24 mg/dL 0.980.87  Sodium 119135 - 147145 mmol/L 136  Potassium 3.5 - 5.1 mmol/L 3.9  Chloride 101 - 111 mmol/L 99(L)  CO2 22 - 32 mmol/L 30  Calcium 8.9 - 10.3 mg/dL 9.6  Total Protein 6.5 - 8.1 g/dL 7.7  Total Bilirubin 0.3 - 1.2 mg/dL 0.6  Alkaline Phos 38 - 126 U/L 68  AST 15 - 41 U/L 20  ALT 17 - 63 U/L 15(L)    Preferred Learning Style:  Prefers to  see and hear stuff.  Learning Readiness:  Ready  Change in progress   MEDICATIONS:    DIETARY INTAKE:   24-hr recall:  B ( AM): PB and jelly sandwich, water  Snk ( AM):   L ( PM): skipped   Snk ( PM): D ( PM): [employer]-2 burritos bean and Soda-his brother bought for him. Snk ( PM): oodles or noodles Beverages: soda, tea , some water, coffee  Usual physical activity: walks to his appts.  Estimated energy needs: 2000  calories 225 mg carbohydrates 150  g protein 56 g fat  Progress Towards Goal(s):  In progress.   Nutritional Diagnosis:  NI-1.4 Inadequate energy intake As related to access to adequate food.  As evidenced by BMI 16 and lack of financial resources to obtain food..    Intervention: HIgh Calorie High Protein diet, meal planning, budgeting of food and smart money tips, low cost high quality protein sources of PB, eggs, beans. Avoid processed foods and quitting smoking. Healthy snacks and importance of drinking water. Importance of fresh fruits and vegetables daily.  1. Follow The Plate Method 2. Cut down on sweets and junk food 3. Try to shop smarter with healthier food choices on sale 4. Try not to skip meals 5. Drink 3 glasses of water per day.  6. Drink milk with meals if you have it.  Eat three meals per day and healthier snacks between meals. Add calories to food with butter, margarine, mayonnaise, gravy or cheese for calories. Gain 2 lbs in the next month. Try to get transportation to food pantries once a month to help with food access.  Teaching Method Utilized:  Visual Auditory Hands on  Handouts given during visit include:  The Plate Method        Meal Plan Card        High Calorie High Protein foods and snacks   Barriers to learning/adherence to lifestyle change: limited transporation and access to healthy foods.  Demonstrated degree of understanding via:  Teach Back   Monitoring/Evaluation:  Dietary intake, exercise, meal planning,  and body weight in 1 month(s).

## 2016-10-10 NOTE — Patient Instructions (Addendum)
Goals 1. Follow The Plate Method 2. Cut down on sweets and junk food 3. Try to shop smarter with healthier food choices on sale 4. Try not to skip meals 5. Drink 3 glasses of water per day. Gain 2 lbs in the next month. Try to get transportation to food pantries once a month to help with food access.

## 2016-11-21 ENCOUNTER — Encounter: Payer: Medicaid Other | Attending: Physician Assistant | Admitting: Nutrition

## 2016-11-21 VITALS — Ht 73.0 in | Wt 136.0 lb

## 2016-11-21 DIAGNOSIS — R636 Underweight: Secondary | ICD-10-CM

## 2016-11-21 DIAGNOSIS — E43 Unspecified severe protein-calorie malnutrition: Secondary | ICD-10-CM | POA: Insufficient documentation

## 2016-11-21 DIAGNOSIS — Z713 Dietary counseling and surveillance: Secondary | ICD-10-CM | POA: Insufficient documentation

## 2016-11-21 DIAGNOSIS — Z681 Body mass index (BMI) 19 or less, adult: Secondary | ICD-10-CM | POA: Diagnosis not present

## 2016-11-21 NOTE — Progress Notes (Signed)
Medical Nutrition Therapy:  Appt start time: 1000  end time:  1030  Assessment:  Primary concerns today: Underweight and malnutrition.Jared Adams LIves with his Aunt. PMH: Depression, problems sleeping, anxiety. Gained 6 lbs. He is eating breakfast, snack and then eating dinner or adding a lighter lunch during the day.  Gets food stamps. Drinking sodas but needs more water.  Has been trying to drink a little bit more water. Feels like he is eating more and better foods since before his first visit.      He got [facility] from the [facility] and drinking 1-2 of those a day when he gets them from the clinic. Willing to buy [employer].    Desires to weigh 150 lbs.   [facility] is [facility] in the next few weeks. Followed by [facility] Nurse also.  He wants to quit smoking but not sure he can do it. Has carious teeth.  Making slow and stead progress. Needs more fresh fruits and vegetables in his diet. Has limited access to local food pantries due to no transportation. Working on getting SSI. Just got Medicaid recently.    CMP Latest Ref Rng & Units 04/29/2016  Glucose 65 - 99 mg/dL 409(W)  BUN 6 - 20 mg/dL 13  Creatinine 1.19 - 1.47 mg/dL 8.29  Sodium 562 - 130 mmol/L 136  Potassium 3.5 - 5.1 mmol/L 3.9  Chloride 101 - 111 mmol/L 99(L)  CO2 22 - 32 mmol/L 30  Calcium 8.9 - 10.3 mg/dL 9.6  Total Protein 6.5 - 8.1 g/dL 7.7  Total Bilirubin 0.3 - 1.2 mg/dL 0.6  Alkaline Phos 38 - 126 U/L 68  AST 15 - 41 U/L 20  ALT 17 - 63 U/L 15(L)    Preferred Learning Style:  Prefers to see and hear stuff.  Learning Readiness:  Ready  Change in progress   MEDICATIONS:    DIETARY INTAKE:   24-hr recall:  B ( AM): Bacon and egg sandwich,  Snk ( AM):  Pack of nabs, Coke L ( PM): skipped   Snk ( PM): D ( PM): Chicken and noodles,  Mt Dew Snk ( PM): water  Beverages: soda, tea , some water, coffee  Usual physical activity: walks to his appts.  Estimated energy needs: 2000   calories 225 mg carbohydrates 150  g protein 56 g fat  Progress Towards Goal(s):  In progress.   Nutritional Diagnosis:  NI-1.4 Inadequate energy intake As related to access to adequate food.  As evidenced by BMI 16 and lack of financial resources to obtain food..    Intervention: HIgh Calorie High Protein diet, meal planning, budgeting of food and smart money tips, low cost high quality protein sources of PB, eggs, beans. Avoid processed foods and quitting smoking. Healthy snacks and importance of drinking water. Importance of fresh fruits and vegetables daily.  Goals 1. Buy green beans, sweet peas, corn, potatoes, apples, oranges, applesauce. 2.  Try eating a pb sandwich and glass of milk for lunch 3. Drink 2 glasses of water per day 4. Buy [employer]  Daily. Gain 2-3 lbs per month  Teaching Method Utilized:  Visual Auditory Hands on  Handouts given during visit include:  The Plate Method        Meal Plan Card        High Calorie High Protein foods and snacks   Barriers to learning/adherence to lifestyle change: limited transporation and access to healthy foods.  Demonstrated degree of  understanding via:  Teach Back   Monitoring/Evaluation:  Dietary intake, exercise, meal planning, and body weight in  3 month(s).

## 2016-11-21 NOTE — Patient Instructions (Signed)
Goals 1. Buy green beans, sweet peas, corn, potatoes, apples, oranges, applesauce. 2.  Try eating a pb sandwich and glass of milk for lunch 3. Drink 2 glasses of water per day 4. Buy [employer] Instant Breakfast  Daily. Gain 2-3 lbs per month

## 2016-12-06 ENCOUNTER — Ambulatory Visit: Payer: Self-pay | Admitting: Physician Assistant

## 2016-12-06 ENCOUNTER — Encounter: Payer: Self-pay | Admitting: Physician Assistant

## 2016-12-06 VITALS — BP 100/64 | HR 66 | Temp 98.4°F | Ht 72.0 in | Wt 134.5 lb

## 2016-12-06 DIAGNOSIS — F39 Unspecified mood [affective] disorder: Secondary | ICD-10-CM

## 2016-12-06 DIAGNOSIS — E46 Unspecified protein-calorie malnutrition: Secondary | ICD-10-CM

## 2016-12-06 DIAGNOSIS — M7022 Olecranon bursitis, left elbow: Secondary | ICD-10-CM

## 2016-12-06 DIAGNOSIS — F17210 Nicotine dependence, cigarettes, uncomplicated: Secondary | ICD-10-CM

## 2016-12-06 NOTE — Patient Instructions (Signed)
Elbow Bursitis Elbow bursitis is inflammation of the fluid-filled sac (bursa) between the tip of your elbow bone (olecranon) and your skin. Elbow bursitis may also be called olecranon bursitis. Normally, the olecranon bursa has only a small amount of fluid in it to cushion and protect your elbow bone. Elbow bursitis causes fluid to build up inside the bursa. Over time, this swelling and inflammation can cause pain when you bend or lean on your elbow. What are the causes? Elbow bursitis may be caused by:  Elbow injury (acute trauma).  Leaning on hard surfaces for long periods of time.  Infection from an injury that breaks the skin near your elbow.  A bone growth (spur) that forms at the tip of your elbow.  A medical condition that causes inflammation in your body, such as gout or rheumatoid arthritis. The cause may also be unknown. What are the signs or symptoms? The first sign of elbow bursitis is usually swelling over the tip of your elbow. This can grow to be the size of a golf ball. This may start suddenly or develop gradually. You may also have:  Pain when bending or leaning on your elbow.  Restricted movement of your elbow. If your bursitis is caused by an infection, symptoms may also include:  Redness, warmth, and tenderness of the elbow.  Drainage of pus from the swollen area over your elbow, if the skin breaks open. How is this diagnosed? Your health care provider may be able to diagnose elbow bursitis based on your signs and symptoms, especially if you have recently been injured. Your health care provider will also do a physical exam. This may include:  X-rays to look for a bone spur or a bone fracture.  Draining fluid from the bursa to test it for infection.  Blood tests to rule out gout or rheumatoid arthritis. How is this treated? Treatment for elbow bursitis depends on the cause. Treatment may include:  Medicines. These may include:  Over-the-counter medicines to  relieve pain and inflammation.  Antibiotic medicines to fight infection.  Injections of anti-inflammatory medicines (steroids).  Wrapping your elbow with a bandage.  Draining fluid from the bursa.  Wearing elbow pads. If your bursitis does not get better with treatment, surgery may be needed to remove the bursa. Follow these instructions at home:  Take medicines only as directed by your health care provider.  If you were prescribed an antibiotic medicine, finish all of it even if you start to feel better.  If your bursitis is caused by an injury, rest your elbow and wear your bandage as directed by your health care provider. You may alsoapply ice to the injured area as directed by your health care provider:  Put ice in a plastic bag.  Place a towel between your skin and the bag.  Leave the ice on for 20 minutes, 2-3 times per day.  Avoid any activities that cause elbow pain.  Use elbow pads or elbow wraps to cushion your elbow. Contact a health care provider if:  You have a fever.  Your symptoms do not get better with treatment.  Your pain or swelling gets worse.  Your elbow pain or swelling goes away and then returns.  You have drainage of pus from the swollen area over your elbow. This information is not intended to replace advice given to you by your health care provider. Make sure you discuss any questions you have with your health care provider. Document Released: 08/02/2006 Document Revised: 12/09/2015 Document   Reviewed: 03/11/2014 Elsevier Interactive Patient Education  2017 Elsevier Inc.  

## 2016-12-06 NOTE — Progress Notes (Signed)
BP 100/64 (BP Location: Right Arm, Patient Position: Sitting, Cuff Size: Normal)   Pulse 66   Temp 98.4 F (36.9 C) (Other (Comment))   Ht 6' (1.829 m)   Wt 134 lb 8 oz (61 kg)   SpO2 97%   BMI 18.24 kg/m    Subjective:    Patient ID: Jared Adams, male    DOB: January 09, 1971, 46 y.o.   MRN: [id number]  HPI: Jared Adams is a 46 y.o. male presenting on 12/06/2016 for Follow-up (c/o non painful fluid to left elbow for 2 weeks)   HPI   Pt has medicaid now but did not bring his card.  He says he was told it would be retroactive to October  He has lost 3 pounds since he was here in February.  Pt says he thinks the most he has ever weighed was no more than 140 pounds.  Discussed with pt that this means he has not really lost much weight, he is just chronically underweight.  He says that yes, that is correct.    He is going to [facility] for [facility].  He says it is helping.    Pt is still smoking.  Pt says he bumped his elbow 2 wk ago.  He says it doesn't hurt any longer but is swelled.   Relevant past medical, surgical, family and social history reviewed and updated as indicated. Interim medical history since our last visit reviewed. Allergies and medications reviewed and updated.   Current Outpatient Prescriptions:  .  citalopram (CELEXA) 20 MG tablet, Take 1 tablet (20 mg total) by mouth daily., Disp: 30 tablet, Rfl: 0 .  hydrOXYzine (ATARAX/VISTARIL) 25 MG tablet, Take 1 tablet (25 mg total) by mouth every 6 (six) hours as needed for anxiety., Disp: 30 tablet, Rfl: 0 .  OLANZapine (ZYPREXA) 5 MG tablet, Take 1 tablet (5 mg total) by mouth at bedtime., Disp: 30 tablet, Rfl: 0 .  traZODone (DESYREL) 100 MG tablet, Take 1 tablet (100 mg total) by mouth at bedtime., Disp: 30 tablet, Rfl: 0 .  Vitamin D, Ergocalciferol, (DRISDOL) 50000 units CAPS capsule, Take 1 capsule (50,000 Units total) by mouth every 7 (seven) days., Disp: 12 capsule, Rfl: 1   Review of Systems   Constitutional: Negative for appetite change, chills, diaphoresis, fatigue, fever and unexpected weight change.  HENT: Negative for congestion, dental problem, drooling, ear pain, facial swelling, hearing loss, mouth sores, sneezing, sore throat, trouble swallowing and voice change.   Eyes: Positive for visual disturbance. Negative for pain, discharge, redness and itching.  Respiratory: Negative for cough, choking, shortness of breath and wheezing.   Cardiovascular: Negative for chest pain, palpitations and leg swelling.  Gastrointestinal: Negative for abdominal pain, blood in stool, constipation, diarrhea and vomiting.  Endocrine: Negative for cold intolerance, heat intolerance and polydipsia.  Genitourinary: Negative for decreased urine volume, dysuria and hematuria.  Musculoskeletal: Negative for arthralgias, back pain and gait problem.  Skin: Negative for rash.  Allergic/Immunologic: Negative for environmental allergies.  Neurological: Positive for headaches. Negative for seizures, syncope and light-headedness.  Hematological: Negative for adenopathy.  Psychiatric/Behavioral: Positive for agitation and dysphoric mood. Negative for suicidal ideas. The patient is nervous/anxious.     Per HPI unless specifically indicated above     Objective:    BP 100/64 (BP Location: Right Arm, Patient Position: Sitting, Cuff Size: Normal)   Pulse 66   Temp 98.4 F (36.9 C) (Other (Comment))   Ht 6' (1.829 m)   Wt  134 lb 8 oz (61 kg)   SpO2 97%   BMI 18.24 kg/m   Wt Readings from Last 3 Encounters:  12/06/16 134 lb 8 oz (61 kg)  11/21/16 136 lb (61.7 kg)  10/10/16 131 lb 12.8 oz (59.8 kg)    Physical Exam  Constitutional: He is oriented to person, place, and time. He appears well-developed and well-nourished.  HENT:  Head: Normocephalic and atraumatic.  Neck: Neck supple.  Cardiovascular: Normal rate and regular rhythm.   Pulmonary/Chest: Effort normal and breath sounds normal. He has no  wheezes.  Abdominal: Soft. Bowel sounds are normal. There is no hepatosplenomegaly. There is no tenderness.  Musculoskeletal: He exhibits no edema.       Left elbow: He exhibits swelling. He exhibits normal range of motion, no deformity and no laceration. No tenderness found.  Walnut sized bursa L elbow.  There is no redness. No tenderness.  Pt has FROM L elbow.    Lymphadenopathy:    He has no cervical adenopathy.  Neurological: He is alert and oriented to person, place, and time.  Skin: Skin is warm and dry.  Psychiatric: He has a normal mood and affect. His behavior is normal.  Vitals reviewed.   Results for orders placed or performed in visit on 09/07/16  IFOBT POC (occult bld, rslt in office)  Result Value Ref Range   IFOBT Negative       Assessment & Plan:     Encounter Diagnoses  Name Primary?  . Malnutrition, unspecified type (HCC) Yes  . Mood disorder (HCC)   . Cigarette nicotine dependence without complication   . Olecranon bursitis of left elbow     -discussed treatment of olecrenon bursitis with drainaged versus ice, ace wrap, nsaid and time.  Pt says he doesn't like needles so he would like the second option.  Discussed that he needs to RTO immediately if it becomes red, hot or painful (as would happen if it got infected).  Pt states understanding.  He is given reading information on this -Pt says he isn't against quitting smoking but says he doesn't want to quit.  I Looked up covered meds and medicaid pays for patches but discussed that he needs to want to quit or it won't work.   He says he doesn't want to at this time -counseled pt to continue to eat protein rich foods and gave a box of supplements (like boost) -[facility].  RTO sooner prn  (The duration of this appointment visit was 25 minutes of face-to-face time with the patient.  Greater than 50% of this time was spent in counseling, explanation of diagnosis, planning  of further management, and coordination of care.)

## 2017-02-19 ENCOUNTER — Other Ambulatory Visit: Payer: Self-pay | Admitting: Physician Assistant

## 2017-02-21 ENCOUNTER — Encounter: Payer: Medicaid Other | Attending: Physician Assistant | Admitting: Nutrition

## 2017-02-21 VITALS — Ht 74.0 in | Wt 128.0 lb

## 2017-02-21 DIAGNOSIS — R636 Underweight: Secondary | ICD-10-CM

## 2017-02-21 DIAGNOSIS — Z713 Dietary counseling and surveillance: Secondary | ICD-10-CM | POA: Diagnosis present

## 2017-02-21 DIAGNOSIS — E43 Unspecified severe protein-calorie malnutrition: Secondary | ICD-10-CM | POA: Insufficient documentation

## 2017-02-21 DIAGNOSIS — E440 Moderate protein-calorie malnutrition: Secondary | ICD-10-CM

## 2017-02-21 NOTE — Progress Notes (Signed)
Medical Nutrition Therapy:  Appt start time: 0930  end time:  1000    Assessment:  Primary concerns today: Underweight and malnutrition.  LIves with his Aunt. PMH: Depression, problems sleeping, anxiety.  Lost 6 lbs since last visit. He notes he is trying to eat more. He has had to move from where he was living and his place is better now than where he was.  Still struggles with food access and enough food.. He has tried to eat more PB and more at meal times.   He notes his PCP is going to have his thyroid tested. He didn't buy any [employer]. Has been drinking more koolaid and sweet tea rather than milk due to limited access to milk.          Doesn't have transportation. Has been referred to [facility] but he prefers to walk and not wait on them. Walked here for his appt.         Has been going to [employer] for counseling and notes its helping some. Current calorie intake remains to be insuffient based on 6 lbs weight loss.           Referred him to [facility] to see if their RD can make home visit and assist with food access and quantity. He is aware of local food pantries but limited transportation..       Followed by [facility] Nurse also.  He wants to quit smoking but not sure he can do it. Has carious teeth.  Needs more protein and calories.in his diet for improved nutrition and weight gain.   Has limited access to local food pantries due to no transportation. Working on getting SSI. Just got Medicaid recently.   Referral made to [facility] [employer] dietitian to assist with education in the home and food assistance/meal prep.    CMP Latest Ref Rng & Units 04/29/2016  Glucose 65 - 99 mg/dL 409(W)  BUN 6 - 20 mg/dL 13  Creatinine 1.19 - 1.47 mg/dL 8.29  Sodium 562 - 130 mmol/L 136  Potassium 3.5 - 5.1 mmol/L 3.9  Chloride 101 - 111 mmol/L 99(L)  CO2 22 - 32 mmol/L 30  Calcium 8.9 - 10.3 mg/dL 9.6  Total Protein 6.5 - 8.1 g/dL 7.7  Total  Bilirubin 0.3 - 1.2 mg/dL 0.6  Alkaline Phos 38 - 126 U/L 68  AST 15 - 41 U/L 20  ALT 17 - 63 U/L 15(L)   Lipid Panel     Component Value Date/Time   CHOL 156 05/03/2016 0623   TRIG 126 05/03/2016 0623   HDL 61 05/03/2016 0623   CHOLHDL 2.6 05/03/2016 0623   VLDL 25 05/03/2016 0623   LDLCALC 70 05/03/2016 0623    Preferred Learning Style:  Prefers to see and hear stuff.  Learning Readiness:  Ready  Change in progress   MEDICATIONS:    DIETARY INTAKE:   24-hr recall:  B ( AM): Fruit loop, 2 cup,  Whole milk,  Snk ( AM): pb crackers L ( PM): PB sandwich, Koolaid or tea.   Snk ( PM): D ( PM): Beef stew with potaotes, Kooilaid Snk ( PM): pB sandwich  Beverages: soda, tea , some water, coffee  Usual physical activity: walks to his appts.  Estimated energy needs: 2000  calories 225 mg carbohydrates 150  g protein 56 g fat  Progress Towards Goal(s):  In progress.   Nutritional Diagnosis:  NI-1.4 Inadequate energy intake As related to  access to adequate food.  As evidenced by BMI 16 and lack of financial resources to obtain food..    Intervention: HIgh Calorie High Protein diet, meal planning, budgeting of food and smart money tips, low cost high quality protein sources of PB, eggs, beans. Avoid processed foods and quitting smoking. Healthy snacks and importance of drinking water. Importance of fresh fruits and vegetables daily.  Try [employer] Instant Breakfast twice  daily. Try egg sandwichs for breakfast instead of sugared cereals.  Increase dried bean and starchy vegetables. Gain 2 lbs per month Try to get transportation to food pantries  Use [facility] for transportation to appts.  Teaching Method Utilized:  Visual Auditory Hands on  Handouts given during visit include:  The Plate Method        Meal Plan Card        High Calorie High Protein foods and snacks   Barriers to learning/adherence to lifestyle change: limited transporation and access to  healthy foods.  Demonstrated degree of understanding via:  Teach Back   Monitoring/Evaluation:  Dietary intake, exercise, meal planning, and body weight in  2 month(s). Recommend to check a Prealbumin and B12 level and Vit D.

## 2017-02-21 NOTE — Patient Instructions (Addendum)
Try [employer] Instant Breakfast twice  daily. Try egg sandwichs for breakfast instead of sugared cereals.  Increase dried bean and starchy vegetables. Gain 2 lbs per month Try to get transportation to food pantries  Use [facility] for transportation to appts.

## 2017-05-08 ENCOUNTER — Ambulatory Visit: Payer: Self-pay | Admitting: Physician Assistant

## 2017-05-09 ENCOUNTER — Ambulatory Visit: Payer: Self-pay | Admitting: Nutrition

## 2019-01-15 ENCOUNTER — Encounter (HOSPITAL_COMMUNITY): Payer: Self-pay | Admitting: Emergency Medicine

## 2019-01-15 ENCOUNTER — Other Ambulatory Visit: Payer: Self-pay

## 2019-01-15 ENCOUNTER — Emergency Department (HOSPITAL_COMMUNITY): Payer: Medicaid Other

## 2019-01-15 ENCOUNTER — Emergency Department (HOSPITAL_COMMUNITY)
Admission: EM | Admit: 2019-01-15 | Discharge: 2019-01-15 | Disposition: A | Discharge status: Home / Self Care | Payer: Medicaid Other | Attending: Emergency Medicine | Admitting: Emergency Medicine

## 2019-01-15 DIAGNOSIS — Y929 Unspecified place or not applicable: Secondary | ICD-10-CM | POA: Insufficient documentation

## 2019-01-15 DIAGNOSIS — Y999 Unspecified external cause status: Secondary | ICD-10-CM | POA: Diagnosis not present

## 2019-01-15 DIAGNOSIS — S0990XA Unspecified injury of head, initial encounter: Secondary | ICD-10-CM | POA: Diagnosis not present

## 2019-01-15 DIAGNOSIS — Y939 Activity, unspecified: Secondary | ICD-10-CM | POA: Diagnosis not present

## 2019-01-15 DIAGNOSIS — Z79899 Other long term (current) drug therapy: Secondary | ICD-10-CM | POA: Insufficient documentation

## 2019-01-15 DIAGNOSIS — F17210 Nicotine dependence, cigarettes, uncomplicated: Secondary | ICD-10-CM | POA: Insufficient documentation

## 2019-01-15 HISTORY — DX: Anxiety disorder, unspecified: F41.9

## 2019-01-15 NOTE — Discharge Instructions (Addendum)
You were seen in the emergency department today following a head injury.  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  Your  CT scan did not show any new abnormality such as a brain bleed, no fractures in your neck, there were some changes to your lungs consistent with tobacco use.  We would like you to follow-up with the [place] [facility] concussion clinic, contact information below or your primary care provider:  Address: [address], [place], [place] [address] Phone: [phone]  Per [facility] Website:   What to Expect: Evaluations at the [facility] All patients at the [facility] are given an extensive three-part evaluation that includes: a computerized test to measure memory, visual processing speed, and reaction time a test that measures the systems that integrate movement, balance, and vision an in-depth review of a detailed symptoms checklist for signs of concussion The evaluation process is critical for the treatment and recovery of a concussed patient as no two concussions are alike. Thankfully, the diagnostic tools that trained professionals use can help to better manage head injuries.  Part of the technology the doctors and staff at [facility] use in their assessments is a computerized examination called ImPACT. This tool uses six tasks to measure memory, visual processing speed, and reaction time. By analyzing the results of the examination and comparing them to average responses or a baseline score for a patient, our staff can make an informed judgment about the patients cognitive functions. In addition to the ImPACT examination, patients are given a Vestibular Ocular Motor Screening test. This is a simple and painless test that focuses on the systems that integrate a patients movement, balance, and vision.  These tests are used in conjunction with a thorough  review of a detailed symptoms checklist to complete the patients evaluation and develop a treatment plan.  You do not need a referral, and you can book an appointment online. Our Concussion Hotline is staffed by trained professionals during our regular office hours: Monday - Thursday from 7:30 AM to 4:30 PM, and Fridays from 7:30 AM to 12:00 PM, and the number to call is [phone]. The Concussion Clinic team meets with patients at our [facility] office. Call today.   Further ED Instructions:   Please call and follow-up within the concussion clinic as well as your primary care provider within the next 3 to 5 days.  In the meantime we would like you to avoid strenuous/over exertional activities such as sports or running.  Please avoid excess screen time utilizing cell phones, computers, or the TV.  Please avoid activities that require significant amount of concentration.  Please try to rest as much as possible.  Please take Tylenol and/or Motrin per over-the-counter dosing instructions for any continued discomfort.  Return to the ER for new or worsening symptoms or any other concerns that you may have.

## 2019-01-15 NOTE — ED Notes (Signed)
Pt return from ct

## 2019-01-15 NOTE — ED Notes (Signed)
Pt tolerating po fluids
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
## 2019-05-25 ENCOUNTER — Emergency Department (HOSPITAL_COMMUNITY): Payer: Medicaid Other

## 2019-05-25 ENCOUNTER — Emergency Department (HOSPITAL_COMMUNITY)
Admission: EM | Admit: 2019-05-25 | Discharge: 2019-05-25 | Disposition: A | Discharge status: Home / Self Care | Payer: Medicaid Other | Attending: Emergency Medicine | Admitting: Emergency Medicine

## 2019-05-25 ENCOUNTER — Other Ambulatory Visit: Payer: Self-pay

## 2019-05-25 ENCOUNTER — Encounter (HOSPITAL_COMMUNITY): Payer: Self-pay

## 2019-05-25 DIAGNOSIS — R05 Cough: Secondary | ICD-10-CM | POA: Diagnosis present

## 2019-05-25 DIAGNOSIS — Z79899 Other long term (current) drug therapy: Secondary | ICD-10-CM | POA: Diagnosis not present

## 2019-05-25 DIAGNOSIS — J069 Acute upper respiratory infection, unspecified: Secondary | ICD-10-CM | POA: Diagnosis not present

## 2019-05-25 DIAGNOSIS — Z20828 Contact with and (suspected) exposure to other viral communicable diseases: Secondary | ICD-10-CM | POA: Diagnosis not present

## 2019-05-25 DIAGNOSIS — F17210 Nicotine dependence, cigarettes, uncomplicated: Secondary | ICD-10-CM | POA: Insufficient documentation

## 2019-05-25 MED ORDER — CLARITIN-D 12 HOUR 5-120 MG PO TB12: 1.0000 | ORAL_TABLET | Freq: Two times a day (BID) | ORAL | 0 refills | Status: AC

## 2019-05-25 MED ORDER — PROMETHAZINE-DM 6.25-15 MG/5ML PO SYRP: 5.0000 mL | ORAL_SOLUTION | Freq: Four times a day (QID) | ORAL | 0 refills | Status: AC | PRN

## 2019-05-25 NOTE — Discharge Instructions (Addendum)
Your oxygen level is 99% on room air.  Your temperature, pulse, and blood pressure are all normal.  Your chest x-ray is negative.  You have some mild nasal congestion.  Please use Claritin-D for this congestion.  Please use promethazine DM for your cough.  This medication may cause drowsiness, please do not drive a vehicle, operate machinery, drink alcohol, or participate in activities requiring concentration when taking this cough medication.  Please wash your hands frequently.  Please use your mask.  Please use appropriate distancing.  Quarantine yourself until you have your COVID-19 results back.

## 2019-05-25 NOTE — ED Triage Notes (Signed)
Pt reports cough and congestion for past 2 days.  Denies any fever.  Pt says he thinks someone he lives with has covid.  Pt says he doesn't feel bad.

## 2019-05-25 NOTE — ED Notes (Signed)
covid spec to lab 

## 2019-05-25 NOTE — ED Notes (Signed)
Pt verbalizes understanding of DC instruction  Unable to sign due to computer restart and continues   DC'd with no questions re discharge, isolation and meds

## 2019-05-25 NOTE — ED Provider Notes (Signed)
[facility] EMERGENCY DEPARTMENT Provider Note   CSN: [id number] Arrival date & time: 05/25/19  2902     History   Chief Complaint Chief Complaint  Patient presents with  . Cough    HPI Jared Adams is a 48 y.o. male.     Patient is a 48 year old male who presents to the emergency department with a cough.  The patient states that he has been feeling bad over the last 2 days.  He has had congestion, cough, and body aches.  The patient denies fever.  Is been no blood in the phlegm that he is coughing up.  He has no history of lung problems, but states he is a smoker.  The patient states that he has been notified that he has had exposure to someone who has been diagnosed with the Covid virus.  He request to be evaluated for his cough and for COVID-19.  Nothing makes the cough any better, and nothing makes it any worse.  The history is provided by the patient.  Cough Associated symptoms: myalgias   Associated symptoms: no chest pain, no ear pain, no eye discharge, no rash, no rhinorrhea, no shortness of breath and no wheezing     Past Medical History:  Diagnosis Date  . Anxiety   . 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 Scoliosis     Patient Active Problem List   Diagnosis Date Noted  . Cigarette nicotine dependence without complication 06/01/2016  . Vitamin D deficiency 05/04/2016  . Vitamin B12 deficiency 05/04/2016  . Severe malnutrition (HCC) 05/03/2016  . MDD (major depressive disorder), recurrent, severe, with psychosis (HCC) 05/02/2016    Past Surgical History:  Procedure Laterality Date  . TYMPANOSTOMY TUBE PLACEMENT          Home Medications    Prior to Admission medications   Medication Sig Start Date End Date Taking? Authorizing Provider  citalopram (CELEXA) 20 MG tablet Take 1 tablet (20 mg total) by mouth daily. 05/10/16   Adonis Brook, NP  hydrOXYzine (ATARAX/VISTARIL) 25 MG tablet Take 1 tablet (25 mg total) by mouth every 6  (six) hours as needed for anxiety. 05/09/16   Adonis Brook, NP  OLANZapine (ZYPREXA) 5 MG tablet Take 1 tablet (5 mg total) by mouth at bedtime. 05/09/16   Adonis Brook, NP  traZODone (DESYREL) 100 MG tablet Take 1 tablet (100 mg total) by mouth at bedtime. 05/09/16   Adonis Brook, NP  Vitamin D, Ergocalciferol, (DRISDOL) 50000 units CAPS capsule TAKE 1 CAPSULE BY MOUTH ONCE A WEEK 02/20/17   Jacquelin Hawking, PA-C    Family History Family History  Problem Relation Age of Onset  . Schizophrenia Maternal Uncle   . Suicidality Maternal Uncle     Social History Social History   Tobacco Use  . Smoking status: Current Every Day Smoker    Packs/day: 1.00    Years: 28.00    Pack years: 28.00    Types: Cigarettes  . Smokeless tobacco: Never Used  Substance Use Topics  . Alcohol use: Yes    Comment: Occ.  . Drug use: Yes    Types: Marijuana     Allergies   Ceclor [cefaclor]   Review of Systems Review of Systems  Constitutional: Negative for activity change and appetite change.  HENT: Negative for congestion, ear discharge, ear pain, facial swelling, nosebleeds, rhinorrhea, sneezing and tinnitus.   Eyes: Negative for photophobia, pain and discharge.  Respiratory: Positive for cough. Negative  for choking, shortness of breath and wheezing.   Cardiovascular: Negative for chest pain, palpitations and leg swelling.  Gastrointestinal: Negative for abdominal pain, blood in stool, constipation, diarrhea, nausea and vomiting.  Genitourinary: Negative for difficulty urinating, dysuria, flank pain, frequency and hematuria.  Musculoskeletal: Positive for myalgias. Negative for back pain, gait problem and neck pain.  Skin: Negative for color change, rash and wound.  Neurological: Negative for dizziness, seizures, syncope, facial asymmetry, speech difficulty, weakness and numbness.  Hematological: Negative for adenopathy. Does not bruise/bleed easily.  Psychiatric/Behavioral: Negative  for agitation, confusion, hallucinations, self-injury and suicidal ideas. The patient is not nervous/anxious.      Physical Exam Updated Vital Signs BP (!) 125/98 (BP Location: Right Arm)   Pulse 89   Temp 98.3 F (36.8 C) (Oral)   Resp 18   Ht 6\' 1"  (1.854 m)   Wt 59 kg   SpO2 96%   BMI 17.15 kg/m   Physical Exam Vitals signs and nursing note reviewed.  Constitutional:      Appearance: He is well-developed. He is not toxic-appearing.  HENT:     Head: Normocephalic.     Right Ear: Tympanic membrane and external ear normal.     Left Ear: Tympanic membrane and external ear normal.     Nose: Congestion present.  Eyes:     General: Lids are normal.     Pupils: Pupils are equal, round, and reactive to light.  Neck:     Musculoskeletal: Normal range of motion and neck supple.     Vascular: No carotid bruit.  Cardiovascular:     Rate and Rhythm: Normal rate and regular rhythm.     Pulses: Normal pulses.     Heart sounds: Normal heart sounds.  Pulmonary:     Effort: No respiratory distress.     Breath sounds: Normal breath sounds.     Comments: There is symmetrical rise and fall of the chest.  Patient speaks in complete sentences without problem. Abdominal:     General: Bowel sounds are normal.     Palpations: Abdomen is soft.     Tenderness: There is no abdominal tenderness. There is no guarding.  Musculoskeletal: Normal range of motion.  Lymphadenopathy:     Head:     Right side of head: No submandibular adenopathy.     Left side of head: No submandibular adenopathy.     Cervical: No cervical adenopathy.  Skin:    General: Skin is warm and dry.  Neurological:     Mental Status: He is alert and oriented to person, place, and time.     Cranial Nerves: No cranial nerve deficit.     Sensory: No sensory deficit.  Psychiatric:        Speech: Speech normal.      ED Treatments / Results  Labs (all labs ordered are listed, but only abnormal results are displayed)  Labs Reviewed - No data to display  EKG None  Radiology No results found.  Procedures Procedures (including critical care time)  Medications Ordered in ED Medications - No data to display   Initial Impression / Assessment and Plan / ED Course  I have reviewed the triage vital signs and the nursing notes.  Pertinent labs & imaging results that were available during my care of the patient were reviewed by me and considered in my medical decision making (see chart for details).          Final Clinical Impressions(s) / ED Diagnoses MDM  Vital signs reviewed.  Pulse oximetry is 96% on room air.  Within normal limits by my interpretation. Patient speaks in complete sentences without problem.  Minimal if any cough while patient was in the emergency department during my examination.  Chest x-ray is negative for cardiopulmonary problem.  COVID-19 test obtained.  Patient to use Claritin-D. Pt ot return if not improving.   Final diagnoses:  Viral URI with cough    ED Discharge Orders         Ordered    loratadine-pseudoephedrine (CLARITIN-D 12 HOUR) 5-120 MG tablet  2 times daily     05/25/19 1230    promethazine-dextromethorphan (PROMETHAZINE-DM) 6.25-15 MG/5ML syrup  4 times daily PRN     05/25/19 1230           Ivery QualeBryant, Remi Lopata, PA-C 05/26/19 2109    Maia PlanLong, Joshua G, MD 05/28/19 1015

## 2019-05-25 NOTE — ED Notes (Signed)
Pt here requesting covid testing   He is asymptomatic at this time, appears anxious opening and closing the door to rm 24 while looking out   HB to room

## 2019-05-26 LAB — SARS CORONAVIRUS 2 (TAT 6-24 HRS): SARS Coronavirus 2: NEGATIVE

## 2019-05-27 ENCOUNTER — Telehealth: Payer: Self-pay | Admitting: Physician Assistant

## 2019-05-27 NOTE — Telephone Encounter (Signed)
Negative COVID results given. Patient results "NOT Detected." Caller expressed understanding. ° °
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
## 2022-01-13 ENCOUNTER — Other Ambulatory Visit: Payer: Self-pay

## 2022-01-13 ENCOUNTER — Encounter (HOSPITAL_COMMUNITY): Payer: Self-pay

## 2022-01-13 ENCOUNTER — Emergency Department (HOSPITAL_COMMUNITY)
Admission: EM | Admit: 2022-01-13 | Discharge: 2022-01-14 | Disposition: A | Discharge status: Home / Self Care | Payer: Medicaid Other | Attending: Emergency Medicine | Admitting: Emergency Medicine

## 2022-01-13 DIAGNOSIS — S59901A Unspecified injury of right elbow, initial encounter: Secondary | ICD-10-CM | POA: Diagnosis present

## 2022-01-13 DIAGNOSIS — S80211A Abrasion, right knee, initial encounter: Secondary | ICD-10-CM | POA: Diagnosis not present

## 2022-01-13 DIAGNOSIS — S52124A Nondisplaced fracture of head of right radius, initial encounter for closed fracture: Secondary | ICD-10-CM | POA: Diagnosis not present

## 2022-01-13 DIAGNOSIS — Z23 Encounter for immunization: Secondary | ICD-10-CM | POA: Insufficient documentation

## 2022-01-13 DIAGNOSIS — Y92410 Unspecified street and highway as the place of occurrence of the external cause: Secondary | ICD-10-CM | POA: Insufficient documentation
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
## 2022-01-14 ENCOUNTER — Emergency Department (HOSPITAL_COMMUNITY): Payer: Medicaid Other

## 2022-01-14 MED ORDER — ACETAMINOPHEN 325 MG PO TABS
650.0000 mg | ORAL_TABLET | Freq: Once | ORAL | Status: AC
  Administered 2022-01-14: 650 mg via ORAL
  Filled 2022-01-14: qty 2

## 2022-01-14 MED ORDER — BACITRACIN ZINC 500 UNIT/GM EX OINT
TOPICAL_OINTMENT | Freq: Three times a day (TID) | CUTANEOUS | Status: DC
  Administered 2022-01-14: 3 via TOPICAL
  Filled 2022-01-14: qty 2.7

## 2022-01-14 MED ORDER — TETANUS-DIPHTH-ACELL PERTUSSIS 5-2.5-18.5 LF-MCG/0.5 IM SUSY
0.5000 mL | PREFILLED_SYRINGE | Freq: Once | INTRAMUSCULAR | Status: AC
Start: 2022-01-14 — End: 2022-01-14
  Administered 2022-01-14: 0.5 mL via INTRAMUSCULAR
  Filled 2022-01-14: qty 0.5

## 2022-01-14 NOTE — ED Notes (Signed)
Pt c/o pain- new  verbal order received

## 2022-01-14 NOTE — Discharge Instructions (Signed)
Please schedule follow-up with orthopedics in the next week or so for a recheck.
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
## 2022-01-18 ENCOUNTER — Telehealth: Payer: Self-pay | Admitting: Orthopaedic Surgery

## 2022-01-18 NOTE — Telephone Encounter (Signed)
Patient called today, 01/18/22, following visit at [facility] emergency room on 01/13/22 for problem of: nondisplaced fracture of right elbow. [employer] does not have transportation to [place], as notes also indicate. Okay to schedule with Dr Hilda Lias when he returns, as patient states he has seen him in past?

## 2022-01-18 NOTE — Telephone Encounter (Signed)
Patient returned call; spoke with Toniann Fail; aware of appointment scheduled.

## 2022-01-24 ENCOUNTER — Ambulatory Visit (INDEPENDENT_AMBULATORY_CARE_PROVIDER_SITE_OTHER): Payer: Medicaid Other

## 2022-01-24 ENCOUNTER — Ambulatory Visit (INDEPENDENT_AMBULATORY_CARE_PROVIDER_SITE_OTHER): Payer: Medicaid Other | Admitting: Orthopaedic Surgery

## 2022-01-24 ENCOUNTER — Encounter: Payer: Self-pay | Admitting: Orthopaedic Surgery

## 2022-01-24 VITALS — BP 88/60 | HR 78 | Ht 75.0 in | Wt 130.0 lb

## 2022-01-24 DIAGNOSIS — S52134A Nondisplaced fracture of neck of right radius, initial encounter for closed fracture: Secondary | ICD-10-CM | POA: Diagnosis not present

## 2022-01-24 DIAGNOSIS — M25511 Pain in right shoulder: Secondary | ICD-10-CM

## 2022-01-24 DIAGNOSIS — M25521 Pain in right elbow: Secondary | ICD-10-CM

## 2022-01-24 MED ORDER — HYDROCODONE-ACETAMINOPHEN 5-325 MG PO TABS: ORAL_TABLET | ORAL | 0 refills | Status: DC
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
## 2022-02-02 ENCOUNTER — Ambulatory Visit (INDEPENDENT_AMBULATORY_CARE_PROVIDER_SITE_OTHER): Payer: Medicaid Other

## 2022-02-02 ENCOUNTER — Encounter: Payer: Self-pay | Admitting: Orthopaedic Surgery

## 2022-02-02 ENCOUNTER — Ambulatory Visit (INDEPENDENT_AMBULATORY_CARE_PROVIDER_SITE_OTHER): Payer: Medicaid Other | Admitting: Orthopaedic Surgery

## 2022-02-02 DIAGNOSIS — M25521 Pain in right elbow: Secondary | ICD-10-CM

## 2022-02-02 DIAGNOSIS — S52124D Nondisplaced fracture of head of right radius, subsequent encounter for closed fracture with routine healing: Secondary | ICD-10-CM

## 2022-02-02 MED ORDER — HYDROCODONE-ACETAMINOPHEN 5-325 MG PO TABS: ORAL_TABLET | ORAL | 0 refills | Status: DC

## 2022-02-02 NOTE — Patient Instructions (Signed)
(  [phone] is the phone number to call to schedule the therapy call TODAY

## 2022-02-02 NOTE — Progress Notes (Signed)
I took my splint off  He took his splint off.  He has some pain of the right elbow.    NV intact.  ROM is good but lacks about 10 degrees full extension.  X-rays were done of the right elbow, reported separately.  Encounter Diagnoses  Name Primary?   Pain in right elbow Yes   Closed nondisplaced fracture of head of right radius with routine healing, subsequent encounter    To go to PT in [place].  I will leave splint off.  Use sling.  Do gentle ROM  I have reviewed the [place] Controlled Substance Reporting System web site prior to prescribing narcotic medicine for this patient.  X-rays on return.  Call if any problem.  Precautions discussed.  Electronically Signed Darreld Mclean, MD 7/20/20238:59 AM

## 2022-02-09 ENCOUNTER — Ambulatory Visit: Payer: Medicaid Other

## 2022-02-10 ENCOUNTER — Telehealth: Payer: Self-pay | Admitting: Orthopaedic Surgery

## 2022-02-10 NOTE — Telephone Encounter (Signed)
Patient called this morning, 02/10/22, for refill:  HYDROcodone-acetaminophen (NORCO/VICODIN) 5-325 MG tablet 28 tablet       [facility], [place]

## 2022-02-12 MED ORDER — HYDROCODONE-ACETAMINOPHEN 5-325 MG PO TABS: ORAL_TABLET | ORAL | 0 refills | Status: AC

## 2022-02-13 ENCOUNTER — Ambulatory Visit: Payer: Medicaid Other | Attending: Orthopaedic Surgery

## 2022-02-16 ENCOUNTER — Ambulatory Visit (INDEPENDENT_AMBULATORY_CARE_PROVIDER_SITE_OTHER): Payer: Medicaid Other

## 2022-02-16 ENCOUNTER — Encounter: Payer: Self-pay | Admitting: Orthopaedic Surgery

## 2022-02-16 ENCOUNTER — Ambulatory Visit (INDEPENDENT_AMBULATORY_CARE_PROVIDER_SITE_OTHER): Payer: Medicaid Other | Admitting: Orthopaedic Surgery

## 2022-02-16 ENCOUNTER — Telehealth: Payer: Self-pay

## 2022-02-16 DIAGNOSIS — S52124D Nondisplaced fracture of head of right radius, subsequent encounter for closed fracture with routine healing: Secondary | ICD-10-CM

## 2022-02-16 NOTE — Progress Notes (Signed)
I am better.  He did not go to PT.  He missed two appointments and has another one tomorrow.  I have told him go go.  He lacks 5 to 7 degrees of full extension of the right elbow.  NV intact.  X-rays were done of the right elbow, reported separately.  Encounter Diagnosis  Name Primary?   Closed nondisplaced fracture of head of right radius with routine healing, subsequent encounter Yes   Go to PT.  Return in two weeks.  Call if any problem.  Precautions discussed.  Electronically Signed Darreld Mclean, MD 8/3/20238:35 AM

## 2022-02-16 NOTE — Telephone Encounter (Signed)
Hydrocodone -Acetaminophen 5/325 MG  Qty 15 tablets  PATIENT USES [place]

## 2022-02-17 ENCOUNTER — Ambulatory Visit: Payer: Medicaid Other | Attending: Orthopaedic Surgery

## 2022-02-17 DIAGNOSIS — M25621 Stiffness of right elbow, not elsewhere classified: Secondary | ICD-10-CM | POA: Diagnosis present

## 2022-02-17 DIAGNOSIS — S52124D Nondisplaced fracture of head of right radius, subsequent encounter for closed fracture with routine healing: Secondary | ICD-10-CM | POA: Diagnosis not present

## 2022-02-17 DIAGNOSIS — M25521 Pain in right elbow: Secondary | ICD-10-CM | POA: Diagnosis present
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
## 2022-02-21 NOTE — Telephone Encounter (Signed)
Patient called and I informed him that Dr. Hilda Lias said, "No narcotics, use Advil or Aleve". He stated "oh well " and hung up.

## 2022-03-02 ENCOUNTER — Ambulatory Visit (INDEPENDENT_AMBULATORY_CARE_PROVIDER_SITE_OTHER): Payer: Medicaid Other | Admitting: Orthopaedic Surgery

## 2022-03-02 ENCOUNTER — Encounter: Payer: Self-pay | Admitting: Orthopaedic Surgery

## 2022-03-02 VITALS — Ht 75.0 in | Wt 118.0 lb

## 2022-03-02 DIAGNOSIS — S52124D Nondisplaced fracture of head of right radius, subsequent encounter for closed fracture with routine healing: Secondary | ICD-10-CM

## 2022-03-02 NOTE — Progress Notes (Signed)
I am much better.  His right elbow has full ROM.  He has been to PT once. I have the notes and reviewed them.  Full ROM right elbow.  NV intact.  Encounter Diagnosis  Name Primary?   Closed nondisplaced fracture of head of right radius with routine healing, subsequent encounter Yes   Continue PT.  No heavy lifting.  Return in three weeks.  Call if any problem.  Precautions discussed.  Electronically Signed Darreld Mclean, MD 8/17/20238:45 AM

## 2022-03-07 ENCOUNTER — Ambulatory Visit: Payer: Medicaid Other

## 2022-03-10 ENCOUNTER — Ambulatory Visit: Payer: Medicaid Other

## 2022-03-10 DIAGNOSIS — M25521 Pain in right elbow: Secondary | ICD-10-CM

## 2022-03-10 DIAGNOSIS — M25621 Stiffness of right elbow, not elsewhere classified: Secondary | ICD-10-CM | POA: Diagnosis not present

## 2022-03-10 NOTE — Therapy (Addendum)
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   Scoliosis    Past Surgical History:  Procedure Laterality Date   TYMPANOSTOMY TUBE PLACEMENT     Patient Active Problem List   Diagnosis Date Noted   Cigarette nicotine dependence without complication 88/41/6606   Vitamin D deficiency 05/04/2016   Vitamin B12 deficiency 05/04/2016   Severe malnutrition ([place]) 05/03/2016   MDD (major depressive disorder), recurrent, severe, with psychosis ([place]) 05/02/2016     REFERRING PROVIDER: Sanjuana Kava, MD  REFERRING DIAG: Closed nondisplaced fracture of head of right radius with routine healing, subsequent encounter  THERAPY DIAG:  Stiffness of right elbow, not elsewhere classified  Pain in right elbow  Rationale for Evaluation and Treatment Rehabilitation  ONSET DATE: 01/13/22  SUBJECTIVE:                                                                                                                                                                                      SUBJECTIVE STATEMENT: Patient reports that his arm is not hurting.   PERTINENT HISTORY: Malnutrition, depression  PAIN:  Are you having pain? Yes: NPRS scale: 0/10 Pain location: right shoulder and elbow Pain description: sharp and shooting Aggravating factors: supination, elbow extension, wearing a sling Relieving factors: none reported  PRECAUTIONS: Other: avoid lifting over 5  pounds  WEIGHT BEARING RESTRICTIONS No  FALLS:  Has patient fallen in last 6 months? No  LIVING ENVIRONMENT: Lives with: lives with an adult companion  OCCUPATION: None, disabled  PLOF: Independent  PATIENT GOALS reduced pain, improved motion  OBJECTIVE: all objective measures were performed on 02/17/22 at his initial evaluation unless otherwise noted  DIAGNOSTIC FINDINGS: 02/26/22 x-ray Impression:  healing right radial head fracture nondisplaced.  COGNITION:  Overall cognitive status: Impaired     SENSATION: None reported  POSTURE: Forward head, rounded shoulders  UPPER EXTREMITY ROM:   Active ROM Right eval Left eval  Shoulder flexion    Shoulder  extension    Shoulder abduction    Shoulder adduction    Shoulder internal rotation    Shoulder external rotation    Elbow flexion 149, soreness WFL   Elbow extension 19 WFL   Wrist flexion    Wrist extension    Wrist ulnar deviation    Wrist radial deviation    Wrist pronation 88 WFL  Wrist supination 70, painful  WFL   (Blank rows = not tested)  UPPER EXTREMITY MMT:  MMT Right eval Left eval  Shoulder flexion    Shoulder extension    Shoulder abduction    Shoulder adduction    Shoulder internal rotation    Shoulder external rotation    Middle trapezius    Lower trapezius    Elbow flexion 4/5, painful 4+/5  Elbow extension 3+/5 5/5  Wrist flexion 4/5 4+/5  Wrist extension 4/5 5/5  Wrist ulnar deviation    Wrist radial deviation    Wrist pronation    Wrist supination    Grip strength (lbs) 55, limited by pain 75  (Blank rows = not tested)  JOINT MOBILITY TESTING:  Mild right radial head hypomobility  PALPATION:  TTP: distal triceps   TODAY'S TREATMENT:                                    8/25 EXERCISE LOG  Exercise Repetitions and Resistance Comments  UBE  10 minutes @ 90 RPM    Pulleys  2 minutes  For elbow extension  Lifting overhead 5 pounds with RUE x 20 reps   Triceps kickback Blue  XTS x 3 minutes   Wall push up 2 minutes   Resisted row Green t-band x 2 minutes   Therabar bending (up and down) Green t-bar (down) and red t-bar (up) x 40 reps each   Horizontal ABD Green t-band x 30 reps   Doorway stretch 4 x 30 seconds    Blank cell = exercise not performed today    PATIENT EDUCATION: Education details: healing, plan of care  Person educated: Patient Education method: Explanation Education comprehension: verbalized understanding   HOME EXERCISE PROGRAM: Doorway stretch and wall push ups  ASSESSMENT:  CLINICAL IMPRESSION: Patient was introduced to multiple new interventions for improved upper extremity strength and mobility. He required moderate verbal and visual cueing with all of today's interventions for proper biomechanics. He experienced no pain or discomfort with any of today's interventions. He was provided a HEP for improved elbow mobility and strength. He reported that his arm felt good upon the conclusion of treatment and that he felt comfortable being discharged at his next visit. Patient will be discharged with an updated HEP at his next appointment, as able.   PHYSICAL THERAPY DISCHARGE SUMMARY  Visits from Start of Care: 2  Current functional level related to goals / functional outcomes: Patient was able to partially meet his goals for skilled physical therapy.    Remaining deficits: Right elbow AROM    Education / Equipment: HEP    Patient agrees to discharge. Patient goals were partially met. Patient is being discharged due to not returning since the last visit.  Jacqulynn Cadet, PT, DPT    OBJECTIVE IMPAIRMENTS decreased activity tolerance, decreased cognition, decreased ROM, decreased strength, hypomobility, impaired UE functional use, and pain.   ACTIVITY LIMITATIONS carrying and lifting  PARTICIPATION LIMITATIONS: shopping  PERSONAL FACTORS Behavior pattern, Time since onset of injury/illness/exacerbation,  and 1-2 comorbidities:  malnutrition, depression  are also affecting patient's functional outcome.   REHAB POTENTIAL: Fair    CLINICAL DECISION MAKING: Evolving/moderate complexity  EVALUATION COMPLEXITY: Moderate   GOALS: Goals reviewed with patient? No  LONG TERM GOALS: Target date: 03/17/2022    Patient will be independent with his HEP.  Baseline:  Goal status: INITIAL  2.  Patient will be able to demonstrate active right elbow extension within 5 degrees of neutral for improved function reaching.  Baseline: 7 degrees Goal status: IN PROGRESS  3.  Patient will be able to lift at least 5 pounds with his right upper extremity for improved function with his daily activities.  Baseline:  Goal status: MET    PLAN: PT FREQUENCY: 1-2x/week  PT DURATION: 4 weeks  PLANNED INTERVENTIONS: Therapeutic exercises, Therapeutic activity, Neuromuscular re-education, Patient/Family education, Self Care, Joint mobilization, Electrical stimulation, Cryotherapy, Moist heat, Taping, Vasopneumatic device, Manual therapy, and Re-evaluation  PLAN FOR NEXT SESSION: UBE, upper extremity strengthening, and bicep stretch   Darlin Coco, PT 03/10/2022, 12:09 PM

## 2022-03-15 ENCOUNTER — Encounter: Payer: Medicaid Other | Admitting: Physical Therapy

## 2022-03-23 ENCOUNTER — Ambulatory Visit: Payer: Medicaid Other | Admitting: Orthopaedic Surgery

## 2022-06-02 ENCOUNTER — Other Ambulatory Visit: Payer: Self-pay

## 2022-06-02 ENCOUNTER — Emergency Department (HOSPITAL_COMMUNITY): Payer: Medicaid Other

## 2022-06-02 ENCOUNTER — Emergency Department (HOSPITAL_COMMUNITY)
Admission: EM | Admit: 2022-06-02 | Discharge: 2022-06-03 | Disposition: A | Discharge status: Home / Self Care | Payer: Medicaid Other | Attending: Emergency Medicine | Admitting: Emergency Medicine

## 2022-06-02 DIAGNOSIS — S61214A Laceration without foreign body of right ring finger without damage to nail, initial encounter: Secondary | ICD-10-CM | POA: Diagnosis not present

## 2022-06-02 DIAGNOSIS — F17210 Nicotine dependence, cigarettes, uncomplicated: Secondary | ICD-10-CM | POA: Diagnosis not present

## 2022-06-02 DIAGNOSIS — W268XXA Contact with other sharp object(s), not elsewhere classified, initial encounter: Secondary | ICD-10-CM | POA: Insufficient documentation

## 2022-06-02 DIAGNOSIS — S6991XA Unspecified injury of right wrist, hand and finger(s), initial encounter: Secondary | ICD-10-CM | POA: Diagnosis present

## 2022-06-02 DIAGNOSIS — Z23 Encounter for immunization: Secondary | ICD-10-CM | POA: Diagnosis not present

## 2022-06-02 MED ORDER — TETANUS-DIPHTH-ACELL PERTUSSIS 5-2.5-18.5 LF-MCG/0.5 IM SUSY
0.5000 mL | PREFILLED_SYRINGE | Freq: Once | INTRAMUSCULAR | Status: AC
  Administered 2022-06-02: 0.5 mL via INTRAMUSCULAR
  Filled 2022-06-02: qty 0.5

## 2022-06-02 NOTE — ED Provider Notes (Signed)
AP-EMERGENCY DEPT [facility] Emergency Department Provider Note MRN:  [id number]  Arrival date & time: 06/03/22     Chief Complaint   Finger Injury   History of Present Illness   Jared Adams is a 51 y.o. year-old male with no pertinent past medical history presenting to the ED with chief complaint of finger injury.  Patient was moving a large log and hit his right ring finger cut by a splinter.  No other injuries or complaints.  Small laceration to the finger.  Review of Systems  A thorough review of systems was obtained and all systems are negative except as noted in the HPI and PMH.   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   Scoliosis     Past Surgical History:  Procedure Laterality Date   TYMPANOSTOMY TUBE PLACEMENT      Family History  Problem Relation Age of Onset   Schizophrenia Maternal Uncle    Suicidality Maternal Uncle     Social History   Socioeconomic History   Marital status: Single    Spouse name: Not on file   Number of children: Not on file   Years of education: Not on file   Highest education level: Not on file  Occupational History   Not on file  Tobacco Use   Smoking status: Every Day    Packs/day: 1.00    Years: 28.00    Total pack years: 28.00    Types: Cigarettes   Smokeless tobacco: Never  Vaping Use   Vaping Use: Never used  Substance and Sexual Activity   Alcohol use: Yes    Comment: Occ.   Drug use: Yes    Types: Marijuana   Sexual activity: Not on file  Other Topics Concern   Not on file  Social History Narrative   Not on file   Social Determinants of Health   Financial Resource Strain: Not on file  Food Insecurity: Not on file  Transportation Needs: Not on file  Physical Activity: Not on file  Stress: Not on file  Social Connections: Not on file  Intimate Partner Violence: Not on file     Physical Exam   Vitals:    06/02/22 2153  BP: 124/86  Pulse: 80  Resp: 18  Temp: 98.3 F (36.8 C)  SpO2: 100%    CONSTITUTIONAL: Well-appearing, NAD NEURO/PSYCH:  Alert and oriented x 3, no focal deficits EYES:  eyes equal and reactive ENT/NECK:  no LAD, no JVD CARDIO: Regular rate, well-perfused, normal S1 and S2 PULM:  CTAB no wheezing or rhonchi GI/GU:  non-distended, non-tender MSK/SPINE:  No gross deformities, no edema SKIN: Small laceration to dorsum of right ring finger   *Additional and/or pertinent findings included in MDM below  Diagnostic and Interventional Summary    EKG Interpretation  Date/Time:    Ventricular Rate:    PR Interval:    QRS Duration:   QT Interval:    QTC Calculation:   R Axis:     Text Interpretation:         Labs Reviewed - No data to display  DG Hand Complete Right  Final Result      Medications  Tdap (BOOSTRIX) injection 0.5 mL (0.5 mLs Intramuscular Given 06/02/22 2343)     Procedures  /  Critical Care .Marland KitchenLaceration Repair  Date/Time: 06/02/2022 11:40 PM  Performed by: Pilar Plate,  Elmer Sow, MD Authorized by: Sabas Sous, MD   Consent:    Consent obtained:  Verbal   Consent given by:  Patient   Risks, benefits, and alternatives were discussed: yes     Risks discussed:  Infection, need for additional repair, nerve damage, poor wound healing, poor cosmetic result, pain, retained foreign body, tendon damage and vascular damage   Alternatives discussed:  No treatment Universal protocol:    Procedure explained and questions answered to patient or proxy's satisfaction: yes     Immediately prior to procedure, a time out was called: yes     Patient identity confirmed:  Verbally with patient Anesthesia:    Anesthesia method:  None Laceration details:    Location:  Finger   Finger location:  R ring finger   Length (cm):  1   Depth (mm):  2 Pre-procedure details:    Preparation:  Patient was prepped and draped in usual sterile fashion and imaging  obtained to evaluate for foreign bodies Exploration:    Limited defect created (wound extended): no     Hemostasis achieved with:  Direct pressure   Imaging obtained: x-ray     Imaging outcome: foreign body not noted     Wound exploration: wound explored through full range of motion and entire depth of wound visualized     Contaminated: no   Treatment:    Area cleansed with:  Soap and water   Amount of cleaning:  Extensive Skin repair:    Repair method:  Tissue adhesive and Steri-Strips   Number of Steri-Strips:  5 Approximation:    Approximation:  Close Repair type:    Repair type:  Simple Post-procedure details:    Dressing:  Open (no dressing)   Procedure completion:  Tolerated well, no immediate complications   ED Course and Medical Decision Making  Initial Impression and Ddx Considering foreign body, doubt fracture, awaiting x-ray, will update tetanus, repair with Dermabond.  Past medical/surgical history that increases complexity of ED encounter: None  Interpretation of Diagnostics I personally reviewed the hand x-ray and my interpretation is as follows: No obvious foreign body or bony abnormality    Patient Reassessment and Ultimate Disposition/Management     Discharge  Patient management required discussion with the following services or consulting groups:  None  Complexity of Problems Addressed Acute complicated illness or Injury  Additional Data Reviewed and Analyzed Further history obtained from: None  Additional Factors Impacting ED Encounter Risk Minor Procedures  Elmer Sow. Pilar Plate, MD [facility] Emergency Medicine [facility] [email or web address]  Final Clinical Impressions(s) / ED Diagnoses     ICD-10-CM   1. Laceration of right ring finger without foreign body without damage to nail, initial encounter  W97.989Q       ED Discharge Orders     None        Discharge Instructions Discussed with and Provided to Patient:      Discharge Instructions      You were evaluated in the Emergency Department and after careful evaluation, we did not find any emergent condition requiring admission or further testing in the hospital.  Your exam/testing today was overall reassuring.  We repaired your laceration with Steri-Strips and medical glue.  This will wear away and fall off with time.  Please return to the Emergency Department if you experience any worsening of your condition.  Thank you for allowing [place] to be a part of your care.        Brittanya Winburn,  Elmer Sow, MD 06/02/22 4627    Sabas Sous, MD 06/03/22 [phone]

## 2022-06-02 NOTE — ED Triage Notes (Signed)
Pt to ED c/o right ring finger laceration, bleeding controlled in triage, Pt has full movement of finger. Reports cut it on a wood splitter.

## 2022-06-02 NOTE — ED Notes (Signed)
ED Provider at bedside. 

## 2022-06-03 NOTE — ED Notes (Signed)
ED Provider at bedside. 

## 2022-06-03 NOTE — Discharge Instructions (Signed)
You were evaluated in the Emergency Department and after careful evaluation, we did not find any emergent condition requiring admission or further testing in the hospital.  Your exam/testing today was overall reassuring.  We repaired your laceration with Steri-Strips and medical glue.  This will wear away and fall off with time.  Please return to the Emergency Department if you experience any worsening of your condition.  Thank you for allowing [place] to be a part of your care.

## 2024-02-12 ENCOUNTER — Encounter (INDEPENDENT_AMBULATORY_CARE_PROVIDER_SITE_OTHER): Payer: Self-pay | Admitting: *Deleted
